# Patient Record
Sex: Female | Born: 2000 | Race: White | Hispanic: No | Marital: Single | Smoking: Never smoker
Health system: Southern US, Community
[De-identification: ages and names within clinical notes are randomized; demographics above are authoritative.]

## PROBLEM LIST (undated history)

## (undated) ENCOUNTER — Emergency Department (HOSPITAL_COMMUNITY): Payer: Medicaid Other | Source: Home / Self Care

## (undated) ENCOUNTER — Inpatient Hospital Stay (HOSPITAL_COMMUNITY): Payer: Self-pay

## (undated) DIAGNOSIS — K219 Gastro-esophageal reflux disease without esophagitis: Secondary | ICD-10-CM

## (undated) DIAGNOSIS — G43909 Migraine, unspecified, not intractable, without status migrainosus: Secondary | ICD-10-CM

## (undated) DIAGNOSIS — F419 Anxiety disorder, unspecified: Secondary | ICD-10-CM

---
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
## 2020-02-27 ENCOUNTER — Emergency Department (HOSPITAL_COMMUNITY): Admit: 2020-02-27 | Payer: Self-pay | Source: Home / Self Care

## 2021-12-12 ENCOUNTER — Inpatient Hospital Stay (HOSPITAL_COMMUNITY)
Admission: AD | Admit: 2021-12-12 | Discharge: 2021-12-12 | Disposition: A | Discharge status: Home / Self Care | Payer: Medicaid Other | Attending: Obstetrics and Gynecology | Admitting: Obstetrics and Gynecology

## 2021-12-12 ENCOUNTER — Encounter (HOSPITAL_COMMUNITY): Payer: Self-pay

## 2021-12-12 ENCOUNTER — Other Ambulatory Visit: Payer: Self-pay

## 2021-12-12 ENCOUNTER — Encounter (HOSPITAL_COMMUNITY): Payer: Self-pay | Admitting: Obstetrics and Gynecology

## 2021-12-12 ENCOUNTER — Ambulatory Visit (HOSPITAL_COMMUNITY)
Admission: EM | Admit: 2021-12-12 | Discharge: 2021-12-12 | Disposition: A | Discharge status: Home / Self Care | Payer: Medicaid Other | Attending: Physician Assistant | Admitting: Physician Assistant

## 2021-12-12 ENCOUNTER — Inpatient Hospital Stay (HOSPITAL_COMMUNITY): Payer: Medicaid Other

## 2021-12-12 DIAGNOSIS — O26891 Other specified pregnancy related conditions, first trimester: Secondary | ICD-10-CM | POA: Diagnosis not present

## 2021-12-12 DIAGNOSIS — N939 Abnormal uterine and vaginal bleeding, unspecified: Secondary | ICD-10-CM

## 2021-12-12 DIAGNOSIS — R112 Nausea with vomiting, unspecified: Secondary | ICD-10-CM

## 2021-12-12 DIAGNOSIS — R1030 Lower abdominal pain, unspecified: Secondary | ICD-10-CM | POA: Insufficient documentation

## 2021-12-12 DIAGNOSIS — Z3201 Encounter for pregnancy test, result positive: Secondary | ICD-10-CM | POA: Diagnosis not present

## 2021-12-12 DIAGNOSIS — O3680X0 Pregnancy with inconclusive fetal viability, not applicable or unspecified: Secondary | ICD-10-CM | POA: Insufficient documentation

## 2021-12-12 DIAGNOSIS — Z3A00 Weeks of gestation of pregnancy not specified: Secondary | ICD-10-CM | POA: Diagnosis not present

## 2021-12-12 DIAGNOSIS — O209 Hemorrhage in early pregnancy, unspecified: Secondary | ICD-10-CM | POA: Diagnosis not present

## 2021-12-12 DIAGNOSIS — R109 Unspecified abdominal pain: Secondary | ICD-10-CM | POA: Diagnosis not present

## 2021-12-12 DIAGNOSIS — O219 Vomiting of pregnancy, unspecified: Secondary | ICD-10-CM | POA: Insufficient documentation

## 2021-12-12 DIAGNOSIS — Z3A01 Less than 8 weeks gestation of pregnancy: Secondary | ICD-10-CM | POA: Insufficient documentation

## 2021-12-12 DIAGNOSIS — O26899 Other specified pregnancy related conditions, unspecified trimester: Secondary | ICD-10-CM

## 2021-12-12 LAB — CBC
HCT: 40.9 % (ref 36.0–46.0)
Hemoglobin: 13.5 g/dL (ref 12.0–15.0)
MCH: 28.7 pg (ref 26.0–34.0)
MCHC: 33 g/dL (ref 30.0–36.0)
MCV: 86.8 fL (ref 80.0–100.0)
Platelets: 220 10*3/uL (ref 150–400)
RBC: 4.71 MIL/uL (ref 3.87–5.11)
RDW: 14 % (ref 11.5–15.5)
WBC: 7.7 10*3/uL (ref 4.0–10.5)
nRBC: 0 % (ref 0.0–0.2)

## 2021-12-12 LAB — URINALYSIS, ROUTINE W REFLEX MICROSCOPIC
Bilirubin Urine: NEGATIVE
Glucose, UA: NEGATIVE mg/dL
Ketones, ur: 80 mg/dL — AB
Leukocytes,Ua: NEGATIVE
Nitrite: NEGATIVE
Protein, ur: 30 mg/dL — AB
RBC / HPF: >50 RBC/hpf — ABNORMAL HIGH (ref 0–5)
Specific Gravity, Urine: 1.021 (ref 1.005–1.030)
pH: 7 (ref 5.0–8.0)

## 2021-12-12 LAB — COMPREHENSIVE METABOLIC PANEL
ALT: 21 U/L (ref 0–44)
AST: 23 U/L (ref 15–41)
Albumin: 4.6 g/dL (ref 3.5–5.0)
Alkaline Phosphatase: 74 U/L (ref 38–126)
Anion gap: 14 (ref 5–15)
BUN: <5 mg/dL — ABNORMAL LOW (ref 6–20)
CO2: 21 mmol/L — ABNORMAL LOW (ref 22–32)
Calcium: 10.3 mg/dL (ref 8.9–10.3)
Chloride: 107 mmol/L (ref 98–111)
Creatinine, Ser: 0.63 mg/dL (ref 0.44–1.00)
GFR, Estimated: >60 mL/min (ref >60)
Glucose, Bld: 102 mg/dL — ABNORMAL HIGH (ref 70–99)
Potassium: 3.2 mmol/L — ABNORMAL LOW (ref 3.5–5.1)
Sodium: 142 mmol/L (ref 135–145)
Total Bilirubin: 1.9 mg/dL — ABNORMAL HIGH (ref 0.3–1.2)
Total Protein: 7.9 g/dL (ref 6.5–8.1)

## 2021-12-12 LAB — WET PREP, GENITAL
Clue Cells Wet Prep HPF POC: NONE SEEN
Sperm: NONE SEEN
Trich, Wet Prep: NONE SEEN
WBC, Wet Prep HPF POC: <10 (ref <10)
Yeast Wet Prep HPF POC: NONE SEEN

## 2021-12-12 LAB — POCT URINALYSIS DIPSTICK, ED / UC
Glucose, UA: NEGATIVE mg/dL
Ketones, ur: >=160 mg/dL — AB
Leukocytes,Ua: NEGATIVE
Nitrite: NEGATIVE
Protein, ur: 30 mg/dL — AB
Specific Gravity, Urine: 1.015 (ref 1.005–1.030)
Urobilinogen, UA: >=8 mg/dL (ref 0.0–1.0)
pH: 6 (ref 5.0–8.0)

## 2021-12-12 LAB — ABO/RH: ABO/RH(D): O POS

## 2021-12-12 LAB — HCG, QUANTITATIVE, PREGNANCY: hCG, Beta Chain, Quant, S: 1431 m[IU]/mL — ABNORMAL HIGH (ref <5)

## 2021-12-12 LAB — POC URINE PREG, ED: Preg Test, Ur: POSITIVE — AB

## 2021-12-12 MED ORDER — LACTATED RINGERS IV BOLUS
1000.0000 mL | Freq: Once | INTRAVENOUS | Status: AC
  Administered 2021-12-12: 1000 mL via INTRAVENOUS

## 2021-12-12 MED ORDER — METOCLOPRAMIDE HCL 5 MG/ML IJ SOLN
10.0000 mg | Freq: Once | INTRAMUSCULAR | Status: AC
  Administered 2021-12-12: 10 mg via INTRAVENOUS
  Filled 2021-12-12: qty 2

## 2021-12-12 MED ORDER — METOCLOPRAMIDE HCL 10 MG PO TABS: 10.0000 mg | ORAL_TABLET | Freq: Four times a day (QID) | ORAL | 0 refills | Status: DC

## 2021-12-12 NOTE — MAU Note (Signed)
Deanna Garcia is a 21 y.o. at [redacted]w[redacted]d here in MAU reporting: for the past 4 days has not been able to keep anything down. About 4 episodes of emesis. Upper abdominal cramping. Has also seen some spotting, that started 4 days ago also.  LMP: 11/02/2021  Onset of complaint: 4 days  Pain score: 3/10  Vitals:   12/12/21 1257  BP: 134/73  Pulse: 82  Resp: 16  Temp: 98.2 F (36.8 C)  SpO2: 98%     Lab orders placed from triage: none

## 2021-12-12 NOTE — ED Provider Notes (Signed)
[facility]    CSN: [id number] Arrival date & time: 12/12/21  1119      History   Chief Complaint Chief Complaint  Patient presents with   Emesis   Possible Pregnancy    HPI Deanna Garcia is a 21 y.o. female.   Patient presents today with a 4-day history of persistent nausea and vomiting.  She does report some generalized abdominal discomfort as well as some diarrhea.  Denies any urinary symptoms including frequency, urgency, dysuria.  She did take an at-home pregnancy test approximately 4 days ago which was positive.  Reports LMP 11/02/2021.  This is her second pregnancy.  She did not have any complications with her previous pregnancy though she did require a C-section.  She is not currently on any birth control.  She does not take medication on a regular basis.  She reports that over the past 4 days she has only been able to eat a few slices of pizza and drink some Gatorade.  Has become persistently harder for her to eat and drink due to severity of symptoms.  She denies any recent medication changes, antibiotic use, suspicious food intake, exposure to sick contacts, recent travel.  Denies history of gastrointestinal disorder other than GERD.    Past Medical History:  Diagnosis Date   Anxiety    Asthma    GERD (gastroesophageal reflux disease)    Migraines     There are no problems to display for this patient.   History reviewed. No pertinent surgical history.  OB History   No obstetric history on file.      Home Medications    Prior to Admission medications   Medication Sig Start Date End Date Taking? Authorizing Provider  acetaminophen (TYLENOL) 325 MG tablet Take 650 mg by mouth every 6 (six) hours as needed. For pain    [provider]  ALAYCEN 1/35 tablet Take 1 tablet by mouth daily. 07/15/14   [provider]  albuterol (PROVENTIL HFA;VENTOLIN HFA) 108 (90 BASE) MCG/ACT inhaler Inhale 2 puffs into the lungs every 6 (six) hours as  needed. For shortness of breath/wheezing    [provider]  clindamycin (CLEOCIN) 150 MG capsule Take 1 capsule (150 mg total) by mouth 4 (four) times daily. Patient taking differently: Take 300 mg by mouth 2 (two) times daily.  08/24/14   Earley Favor, NP  dicyclomine (BENTYL) 20 MG tablet Take 1 tablet (20 mg total) by mouth 2 (two) times daily. 08/28/14   Rolland Porter, MD  diphenoxylate-atropine (LOMOTIL) 2.5-0.025 MG per tablet Take 1 tablet by mouth 4 (four) times daily as needed for diarrhea or loose stools. 08/28/14   Rolland Porter, MD  FLUoxetine (PROZAC) 40 MG capsule Take 40 mg by mouth daily.    [provider]  hydrOXYzine (VISTARIL) 25 MG capsule Take 25 mg by mouth 3 (three) times daily as needed for anxiety.    [provider]  ondansetron (ZOFRAN ODT) 4 MG disintegrating tablet Take 1 tablet (4 mg total) by mouth every 8 (eight) hours as needed for nausea. 08/28/14   Rolland Porter, MD  predniSONE (DELTASONE) 20 MG tablet Take 3 tablets (60 mg total) by mouth daily. Patient not taking: Reported on 08/28/2014 09/03/13   Niel Hummer, MD  QVAR 80 MCG/ACT inhaler Take 1 puff by mouth 2 (two) times daily as needed. Shortness of breath 07/15/14   [provider]    Family History History reviewed. No pertinent family history.  Social  History Social History   Tobacco Use   Smoking status: Passive Smoke Exposure - Never Smoker   Smokeless tobacco: Never  Substance Use Topics   Alcohol use: No   Drug use: No     Allergies   Penicillins and Amoxicillin   Review of Systems Review of Systems  Constitutional:  Positive for activity change. Negative for appetite change, fatigue and fever.  Respiratory:  Negative for cough and shortness of breath.   Cardiovascular:  Negative for chest pain.  Gastrointestinal:  Positive for abdominal pain, diarrhea, nausea and vomiting.  Genitourinary:  Positive for menstrual problem. Negative for dysuria, frequency, pelvic  pain, urgency, vaginal bleeding, vaginal discharge and vaginal pain.     Physical Exam Triage Vital Signs ED Triage Vitals  Enc Vitals Group     BP 12/12/21 1140 134/81     Pulse Rate 12/12/21 1140 80     Resp 12/12/21 1140 16     Temp 12/12/21 1140 99.5 F (37.5 C)     Temp Source 12/12/21 1140 Oral     SpO2 12/12/21 1140 97 %     Weight 12/12/21 1142 220 lb 0.3 oz (99.8 kg)     Height 12/12/21 1142 5\' 8"  (1.727 m)     Head Circumference --      Peak Flow --      Pain Score 12/12/21 1140 4     Pain Loc --      Pain Edu? --      Excl. in GC? --    No data found.  Updated Vital Signs BP 134/81 (BP Location: Left Arm)   Pulse 80   Temp 99.5 F (37.5 C) (Oral)   Resp 16   Ht 5\' 8"  (1.727 m)   Wt 220 lb 0.3 oz (99.8 kg)   LMP 11/02/2021 (Exact Date)   SpO2 97%   BMI 33.45 kg/m   Visual Acuity Right Eye Distance:   Left Eye Distance:   Bilateral Distance:    Right Eye Near:   Left Eye Near:    Bilateral Near:     Physical Exam Vitals reviewed.  Constitutional:      General: She is awake. She is not in acute distress.    Appearance: Normal appearance. She is well-developed. She is not ill-appearing.     Comments: Very pleasant female appears stated age in no acute distress sitting comfortably in exam room  HENT:     Head: Normocephalic and atraumatic.     Mouth/Throat:     Mouth: Mucous membranes are moist.     Pharynx: Uvula midline. No oropharyngeal exudate or posterior oropharyngeal erythema.  Cardiovascular:     Rate and Rhythm: Normal rate and regular rhythm.     Heart sounds: Normal heart sounds, S1 normal and S2 normal. No murmur heard. Pulmonary:     Effort: Pulmonary effort is normal.     Breath sounds: Normal breath sounds. No wheezing, rhonchi or rales.     Comments: Clear to auscultation bilaterally Abdominal:     General: Bowel sounds are normal.     Palpations: Abdomen is soft.     Tenderness: There is no abdominal tenderness. There is no  right CVA tenderness, left CVA tenderness, guarding or rebound.     Comments: Benign abdominal exam  Psychiatric:        Behavior: Behavior is cooperative.      UC Treatments / Results  Labs (all labs ordered are listed, but only abnormal results are  displayed) Labs Reviewed  POC URINE PREG, ED - Abnormal; Notable for the following components:      Result Value   Preg Test, Ur POSITIVE (*)    All other components within normal limits  POCT URINALYSIS DIPSTICK, ED / UC - Abnormal; Notable for the following components:   Bilirubin Urine SMALL (*)    Ketones, ur >=160 (*)    Hgb urine dipstick LARGE (*)    Protein, ur 30 (*)    All other components within normal limits    EKG   Radiology No results found.  Procedures Procedures (including critical care time)  Medications Ordered in UC Medications - No data to display  Initial Impression / Assessment and Plan / UC Course  I have reviewed the triage vital signs and the nursing notes.  Pertinent labs & imaging results that were available during my care of the patient were reviewed by me and considered in my medical decision making (see chart for details).     Pregnancy test was positive in clinic today.  UA showed evidence of decreased oral intake and significant dehydration.  Discussed that she would be better suited in the MAU for further evaluation and management as they have the ability to get stat labs and provide symptomatic management.  She is agreeable and will go directly to MAU for further evaluation and management.  Her vital signs are stable at time of discharge.  She is safe for private transport.  Final Clinical Impressions(s) / UC Diagnoses   Final diagnoses:  Intractable nausea and vomiting  Positive pregnancy test     Discharge Instructions      Please go to the MAU for further evaluation and management.     ED Prescriptions   None    PDMP not reviewed this encounter.   Jeani Hawking,  PA-C 12/12/21 1204

## 2021-12-12 NOTE — MAU Provider Note (Signed)
History     CSN: [id number]  Arrival date and time: 12/12/21 1210   None     Chief Complaint  Patient presents with   Abdominal Pain   Vaginal Bleeding   Nausea   HPI Deanna Garcia is a 21 y.o. G2P1001 at [redacted]w[redacted]d by LMP who presents to MAU for nausea, vomiting, and vaginal spotting. Nausea and vomiting started 4 days ago. She reports that she has had numerous episodes of vomiting, approximately 6 per day as well as dry heaves. She reports that she has only tolerated 2 slices of pizza and gatorade since the vomiting started. She reports she has not had anything to eat or drink today. She does not have any nausea medications at home. She is also reporting some vaginal spotting a mid-lower abdominal cramping that started 5 days ago. Pain is intermittent and is not worsened or aggravated by anything. She currently rates pain 3/10. She denies urinary s/s, vaginal discharge, odor, or itching. LMP was 11/02/21. Patient does not have an OBGYN.   OB History     Gravida  2   Para  1   Term  1   Preterm  0   AB  0   Living  1      SAB  0   IAB  0   Ectopic  0   Multiple  0   Live Births  1           Past Medical History:  Diagnosis Date   Anxiety    Asthma    GERD (gastroesophageal reflux disease)    Migraines     Past Surgical History:  Procedure Laterality Date   CESAREAN SECTION      History reviewed. No pertinent family history.  Social History   Tobacco Use   Smoking status: Never    Passive exposure: Yes   Smokeless tobacco: Never  Substance Use Topics   Alcohol use: No   Drug use: Not Currently    Types: Marijuana    Allergies:  Allergies  Allergen Reactions   Penicillins Hives   Amoxicillin Rash    Medications Prior to Admission  Medication Sig Dispense Refill Last Dose   acetaminophen (TYLENOL) 325 MG tablet Take 650 mg by mouth every 6 (six) hours as needed. For pain      albuterol (PROVENTIL HFA;VENTOLIN HFA) 108 (90 BASE) MCG/ACT  inhaler Inhale 2 puffs into the lungs every 6 (six) hours as needed. For shortness of breath/wheezing       Review of Systems  Constitutional: Negative.   Respiratory: Negative.    Cardiovascular: Negative.   Gastrointestinal:  Positive for abdominal pain, nausea and vomiting.  Genitourinary:  Positive for vaginal bleeding. Negative for dysuria, frequency and hematuria.  Musculoskeletal: Negative.   Neurological: Negative.    Physical Exam   Blood pressure 134/73, pulse 82, temperature 98.2 F (36.8 C), temperature source Oral, resp. rate 16, height 5\' 8"  (1.727 m), weight 109.5 kg, last menstrual period 11/02/2021, SpO2 98 %.  Physical Exam Vitals and nursing note reviewed.  Constitutional:      General: She is not in acute distress. Cardiovascular:     Rate and Rhythm: Normal rate.  Pulmonary:     Effort: Pulmonary effort is normal.  Abdominal:     Palpations: Abdomen is soft.     Tenderness: There is no abdominal tenderness. There is no guarding.  Genitourinary:    Comments: Blind swabs collected Skin:    General: Skin is  warm and dry.  Neurological:     General: No focal deficit present.     Mental Status: She is alert and oriented to person, place, and time.  Psychiatric:        Mood and Affect: Mood normal.        Behavior: Behavior normal.   US OB LESS THAN 14 WEEKS WITH OB TRANSVAGINAL  Result Date: 12/12/2021 CLINICAL DATA:  Abdominal pain EXAM: OBSTETRIC <14 WK ULTRASOUND TECHNIQUE: Transabdominal ultrasound was performed for evaluation of the gestation as well as the maternal uterus and adnexal regions. COMPARISON:  None Available. FINDINGS: Intrauterine gestational sac: None Yolk sac:  Not Visualized. Embryo:  Not Visualized. Cardiac Activity: Not Visualized. Subchorionic hemorrhage:  None visualized. Maternal uterus/adnexae: Normal appearance of the bilateral ovaries. No free fluid in the pelvis. IMPRESSION: No intrauterine gestational sac, yolk sac, or fetal  pole identified. In the setting of positive pregnancy test and no definite intrauterine pregnancy, this reflects a pregnancy of unknown location. Differential considerations include early normal IUP, abnormal IUP, or nonvisualized ectopic pregnancy. Differentiation is achieved with serial beta HCG supplemented by repeat sonography as clinically warranted. Electronically Signed   By: Allegra Lai M.D.   On: 12/12/2021 15:20    MAU Course  Procedures  MDM UA, urine culture, CBC, CMP, HCG, ABO/RH, wet prep, GC/CT, [place], 2L LR bolus and Reglan ordered  Patient without any episodes of vomiting throughout stay. Reports improvement in nausea after fluids and medication. Will send rx to patient's pharmacy. Patient encouraged to push fluids, small/frequent bland meals/snacks and then advance diet as tolerated.  Blood type O pos, Rhogam not indicated. Wet prep negative, GC/CT pending.   HCG 1400 without evidence of GS or ectopic pregnancy. Differentials include early normal IUP, abnormal IUP, or non-visualized ectopic. Patient to return in 48 hours for repeat bHCG. Due to schedule, patient will return to MAU Saturday morning (7/22) for labs. Strict ectopic precautions reviewed.    Assessment and Plan  Pregnancy of unknown anatomic location Nausea and vomiting in pregnancy Abdominal pain in pregnancy Vaginal spotting in pregnancy  - Discharge home in stable condition - Rx for Reglan sent to pharmacy - Repeat bHCG on Saturday 7/22 due to patient schedule - Ectopic precautions reviewed - Return to MAU sooner for new/worsening symptoms    Brand Males, CNM 12/12/2021, 4:49 PM

## 2021-12-12 NOTE — Discharge Instructions (Signed)
Please go to the MAU for further evaluation and management.

## 2021-12-12 NOTE — ED Triage Notes (Signed)
Patient states she has been throwing up constantly for the past 4 days, unable to keep anything down. Patient took home pregnancy tests and these were positive.   No known dietary or medication changes. No one at home with similar symptoms.

## 2021-12-13 LAB — GC/CHLAMYDIA PROBE AMP (~~LOC~~) NOT AT ARMC
Chlamydia: NEGATIVE
Comment: NEGATIVE
Comment: NORMAL
Neisseria Gonorrhea: NEGATIVE

## 2021-12-14 ENCOUNTER — Encounter (HOSPITAL_COMMUNITY): Payer: Self-pay | Admitting: Obstetrics and Gynecology

## 2021-12-14 ENCOUNTER — Inpatient Hospital Stay (HOSPITAL_COMMUNITY)
Admission: AD | Admit: 2021-12-14 | Discharge: 2021-12-14 | Disposition: A | Discharge status: Home / Self Care | Payer: Medicaid Other | Attending: Obstetrics and Gynecology | Admitting: Obstetrics and Gynecology

## 2021-12-14 DIAGNOSIS — O009 Unspecified ectopic pregnancy without intrauterine pregnancy: Secondary | ICD-10-CM | POA: Diagnosis not present

## 2021-12-14 DIAGNOSIS — Z6790 Unspecified blood type, Rh positive: Secondary | ICD-10-CM

## 2021-12-14 DIAGNOSIS — O3680X0 Pregnancy with inconclusive fetal viability, not applicable or unspecified: Secondary | ICD-10-CM

## 2021-12-14 LAB — CBC
HCT: 35 % — ABNORMAL LOW (ref 36.0–46.0)
Hemoglobin: 11.5 g/dL — ABNORMAL LOW (ref 12.0–15.0)
MCH: 28.7 pg (ref 26.0–34.0)
MCHC: 32.9 g/dL (ref 30.0–36.0)
MCV: 87.3 fL (ref 80.0–100.0)
Platelets: 179 10*3/uL (ref 150–400)
RBC: 4.01 MIL/uL (ref 3.87–5.11)
RDW: 14.2 % (ref 11.5–15.5)
WBC: 5.4 10*3/uL (ref 4.0–10.5)
nRBC: 0 % (ref 0.0–0.2)

## 2021-12-14 LAB — HCG, QUANTITATIVE, PREGNANCY: hCG, Beta Chain, Quant, S: 1413 m[IU]/mL — ABNORMAL HIGH (ref <5)

## 2021-12-14 NOTE — MAU Provider Note (Signed)
History     CSN: [id number]  Arrival date and time: 12/14/21 0846   Event Date/Time   First Provider Initiated Contact with Patient 12/14/21 [phone]      Chief Complaint  Patient presents with   Vaginal Bleeding   HPI Deanna Garcia is a 21 y.o. G2P1001at [redacted]w[redacted]d by LMP who presents to MAU with chief complaint of bleeding. This is a recurrent problem for which patient was evaluated in MAU on 07/19 and diagnosed with Pregnancy of Unknown Location. She endorses seeing smears of blood when she wiped after voiding this morning. This happened on two occasions.  She denies ongoing active vaginal bleeding. She has not needed to don a pad. She has not visualized clots and is not experiencing dizziness, weakness or activity intolerance. She is remote from sexual intercourse.  She also reports ongoing lower abdominal pain. Pain score 4/10 on arrival to MAU. She has not taken medication for this complaint and declines pain medication on arrival to MAU.   OB History     Gravida  2   Para  1   Term  1   Preterm  0   AB  0   Living  1      SAB  0   IAB  0   Ectopic  0   Multiple  0   Live Births  1           Past Medical History:  Diagnosis Date   Anxiety    Asthma    GERD (gastroesophageal reflux disease)    Migraines     Past Surgical History:  Procedure Laterality Date   CESAREAN SECTION      No family history on file.  Social History   Tobacco Use   Smoking status: Never    Passive exposure: Yes   Smokeless tobacco: Never  Substance Use Topics   Alcohol use: No   Drug use: Not Currently    Types: Marijuana    Allergies:  Allergies  Allergen Reactions   Penicillins Hives   Amoxicillin Rash    Medications Prior to Admission  Medication Sig Dispense Refill Last Dose   metoCLOPramide (REGLAN) 10 MG tablet Take 1 tablet (10 mg total) by mouth every 6 (six) hours. 30 tablet 0 12/14/2021   acetaminophen (TYLENOL) 325 MG tablet Take 650 mg by mouth every  6 (six) hours as needed. For pain      albuterol (PROVENTIL HFA;VENTOLIN HFA) 108 (90 BASE) MCG/ACT inhaler Inhale 2 puffs into the lungs every 6 (six) hours as needed. For shortness of breath/wheezing       Review of Systems  Gastrointestinal:  Positive for abdominal pain.  Genitourinary:  Positive for vaginal bleeding.  All other systems reviewed and are negative.  Physical Exam   Blood pressure (!) 147/80, pulse 69, temperature 98.3 F (36.8 C), resp. rate 18, height 5\' 8"  (1.727 m), weight 112 kg, last menstrual period 11/02/2021.  Physical Exam Vitals and nursing note reviewed. Exam conducted with a chaperone present.  Constitutional:      Appearance: Normal appearance. She is not ill-appearing.  Cardiovascular:     Rate and Rhythm: Normal rate and regular rhythm.     Pulses: Normal pulses.     Heart sounds: Normal heart sounds.  Pulmonary:     Effort: Pulmonary effort is normal.     Breath sounds: Normal breath sounds.  Abdominal:     Tenderness: There is no abdominal tenderness. There is no right CVA  tenderness or left CVA tenderness.  Genitourinary:    Comments: Pelvic exam: External genitalia normal, vaginal walls pink and well rugated, cervix visually closed, no lesions noted. Negligible dark red blood visible on tip of speculum. No blood visualized in vault.    Skin:    Capillary Refill: Capillary refill takes less than 2 seconds.  Neurological:     Mental Status: She is alert and oriented to person, place, and time.  Psychiatric:        Mood and Affect: Mood normal.        Behavior: Behavior normal.        Thought Content: Thought content normal.        Judgment: Judgment normal.     MAU Course  Procedures  MDM  --EMR reviewed. Patient in MAU 07/19. PUL on ultrasound. Discussed with patient repeat ultrasound not indicated today unless gross change in symptoms. Patient verbalizes understanding.  Orders Placed This Encounter  Procedures   CBC   hCG,  quantitative, pregnancy   Patient Vitals for the past 24 hrs:  BP Temp Pulse Resp Height Weight  12/14/21 1130 140/73 -- 63 -- -- --  12/14/21 0859 (!) 147/80 98.3 F (36.8 C) 69 18 5\' 8"  (1.727 m) 112 kg   Results for orders placed or performed during the hospital encounter of 12/14/21 (from the past 24 hour(s))  CBC     Status: Abnormal   Collection Time: 12/14/21  9:19 AM  Result Value Ref Range   WBC 5.4 4.0 - 10.5 K/uL   RBC 4.01 3.87 - 5.11 MIL/uL   Hemoglobin 11.5 (L) 12.0 - 15.0 g/dL   HCT 35.0 (L) 36.0 - 46.0 %   MCV 87.3 80.0 - 100.0 fL   MCH 28.7 26.0 - 34.0 pg   MCHC 32.9 30.0 - 36.0 g/dL   RDW 14.2 11.5 - 15.5 %   Platelets 179 150 - 400 K/uL   nRBC 0.0 0.0 - 0.2 %   Component     Latest Ref Rng 12/12/2021 12/14/2021  HCG, Beta Chain, Quant, S     <5 mIU/mL 1,431 (H)  1,413 (H)     Ultrasound report from 07/19:  Narrative & Impression  CLINICAL DATA:  Abdominal pain   EXAM: OBSTETRIC <14 WK ULTRASOUND   TECHNIQUE: Transabdominal ultrasound was performed for evaluation of the gestation as well as the maternal uterus and adnexal regions.   COMPARISON:  None Available.   FINDINGS: Intrauterine gestational sac: None   Yolk sac:  Not Visualized.   Embryo:  Not Visualized.   Cardiac Activity: Not Visualized.   Subchorionic hemorrhage:  None visualized.   Maternal uterus/adnexae: Normal appearance of the bilateral ovaries. No free fluid in the pelvis.   IMPRESSION: No intrauterine gestational sac, yolk sac, or fetal pole identified. In the setting of positive pregnancy test and no definite intrauterine pregnancy, this reflects a pregnancy of unknown location. Differential considerations include early normal IUP, abnormal IUP, or nonvisualized ectopic pregnancy. Differentiation is achieved with serial beta HCG supplemented by repeat sonography as clinically warranted.     Electronically Signed   By: Yetta Glassman M.D.   On: 12/12/2021 15:20    Assessment and Plan  --21 y.o. G2P1001 with previously PUL (diagnosed about 48 hours ago) --Inappropriate change in quant hCG --Hgb now 11.5 --Blood type Rh POS --Discharge home in stable condition with ectopic/bleeding precautions  F/U: --Appt made for repeat stat Quant hCG at [facility] on 12/17/2021  Darlina Rumpf, [place],  MSN, CNM 12/14/2021, 1:36 PM

## 2021-12-14 NOTE — MAU Note (Addendum)
.  Deanna Garcia is a 21 y.o. at [redacted]w[redacted]d here in MAU reporting: increased vaginal bleeding since her last visit 2 days ago. Pt stated she started having some more cramping last night and bleeding is more than it was there other day. Scheduled for repeat BHCG tomorrow.  LMP:  Onset of complaint: today Pain score: 4 Vitals:   12/14/21 0859  BP: (!) 147/80  Pulse: 69  Resp: 18  Temp: 98.3 F (36.8 C)     FHT:n/a Lab orders placed from triage:

## 2021-12-15 ENCOUNTER — Other Ambulatory Visit (HOSPITAL_COMMUNITY): Payer: Self-pay

## 2021-12-15 LAB — CULTURE, OB URINE

## 2021-12-17 ENCOUNTER — Encounter (HOSPITAL_COMMUNITY): Payer: Self-pay | Admitting: Obstetrics and Gynecology

## 2021-12-17 ENCOUNTER — Other Ambulatory Visit: Payer: Self-pay

## 2021-12-17 ENCOUNTER — Inpatient Hospital Stay (HOSPITAL_COMMUNITY)
Admission: AD | Admit: 2021-12-17 | Discharge: 2021-12-17 | Disposition: A | Discharge status: Home / Self Care | Payer: Medicaid Other | Attending: Obstetrics and Gynecology | Admitting: Obstetrics and Gynecology

## 2021-12-17 ENCOUNTER — Ambulatory Visit (INDEPENDENT_AMBULATORY_CARE_PROVIDER_SITE_OTHER): Payer: Self-pay | Admitting: General Practice

## 2021-12-17 VITALS — BP 134/74 | HR 66 | Ht 68.0 in | Wt 249.0 lb

## 2021-12-17 DIAGNOSIS — O3680X0 Pregnancy with inconclusive fetal viability, not applicable or unspecified: Secondary | ICD-10-CM

## 2021-12-17 DIAGNOSIS — Z3A01 Less than 8 weeks gestation of pregnancy: Secondary | ICD-10-CM | POA: Diagnosis not present

## 2021-12-17 DIAGNOSIS — O009 Unspecified ectopic pregnancy without intrauterine pregnancy: Secondary | ICD-10-CM | POA: Diagnosis not present

## 2021-12-17 LAB — COMPREHENSIVE METABOLIC PANEL
ALT: 17 U/L (ref 0–44)
AST: 16 U/L (ref 15–41)
Albumin: 3.9 g/dL (ref 3.5–5.0)
Alkaline Phosphatase: 63 U/L (ref 38–126)
Anion gap: 7 (ref 5–15)
BUN: <5 mg/dL — ABNORMAL LOW (ref 6–20)
CO2: 24 mmol/L (ref 22–32)
Calcium: 9.2 mg/dL (ref 8.9–10.3)
Chloride: 109 mmol/L (ref 98–111)
Creatinine, Ser: 0.62 mg/dL (ref 0.44–1.00)
GFR, Estimated: >60 mL/min (ref >60)
Glucose, Bld: 104 mg/dL — ABNORMAL HIGH (ref 70–99)
Potassium: 3.5 mmol/L (ref 3.5–5.1)
Sodium: 140 mmol/L (ref 135–145)
Total Bilirubin: 0.7 mg/dL (ref 0.3–1.2)
Total Protein: 6.5 g/dL (ref 6.5–8.1)

## 2021-12-17 LAB — CBC WITH DIFFERENTIAL/PLATELET
Abs Immature Granulocytes: 0.03 10*3/uL (ref 0.00–0.07)
Basophils Absolute: 0 10*3/uL (ref 0.0–0.1)
Basophils Relative: 1 %
Eosinophils Absolute: 0.1 10*3/uL (ref 0.0–0.5)
Eosinophils Relative: 1 %
HCT: 35.4 % — ABNORMAL LOW (ref 36.0–46.0)
Hemoglobin: 11.6 g/dL — ABNORMAL LOW (ref 12.0–15.0)
Immature Granulocytes: 0 %
Lymphocytes Relative: 26 %
Lymphs Abs: 2 10*3/uL (ref 0.7–4.0)
MCH: 29.4 pg (ref 26.0–34.0)
MCHC: 32.8 g/dL (ref 30.0–36.0)
MCV: 89.6 fL (ref 80.0–100.0)
Monocytes Absolute: 0.8 10*3/uL (ref 0.1–1.0)
Monocytes Relative: 11 %
Neutro Abs: 4.8 10*3/uL (ref 1.7–7.7)
Neutrophils Relative %: 61 %
Platelets: 185 10*3/uL (ref 150–400)
RBC: 3.95 MIL/uL (ref 3.87–5.11)
RDW: 14.6 % (ref 11.5–15.5)
WBC: 7.9 10*3/uL (ref 4.0–10.5)
nRBC: 0 % (ref 0.0–0.2)

## 2021-12-17 LAB — BETA HCG QUANT (REF LAB): hCG Quant: 1341 m[IU]/mL

## 2021-12-17 LAB — HCG, QUANTITATIVE, PREGNANCY: hCG, Beta Chain, Quant, S: 1583 m[IU]/mL — ABNORMAL HIGH (ref <5)

## 2021-12-17 MED ORDER — METHOTREXATE FOR ECTOPIC PREGNANCY
50.0000 mg/m2 | Freq: Once | INTRAMUSCULAR | Status: AC
  Administered 2021-12-17: 117.5 mg via INTRAMUSCULAR
  Filled 2021-12-17: qty 10

## 2021-12-17 NOTE — MAU Provider Note (Signed)
History     [id number]  Arrival date and time: 12/17/21 1349    Chief Complaint  Patient presents with   Ectopic Pregnancy     HPI Deanna Garcia is a 21 y.o. at [redacted]w[redacted]d who presents for management of ectopic. Has had abnormal trend of HCGs.   7/19 1431 7/21 1413 7/23 1341 Reports some vaginal bleeding. Not saturating pads or passing blood clots. Also complains of generalized abdominal cramping. Nothing seen on ultrasound last week. Was sent here from the office for methotrexate.   OB History     Gravida  2   Para  1   Term  1   Preterm  0   AB  0   Living  1      SAB  0   IAB  0   Ectopic  0   Multiple  0   Live Births  1           Past Medical History:  Diagnosis Date   Anxiety    Asthma    GERD (gastroesophageal reflux disease)    Migraines     Past Surgical History:  Procedure Laterality Date   CESAREAN SECTION      No family history on file.  Allergies  Allergen Reactions   Penicillins Hives   Amoxicillin Rash    No current facility-administered medications on file prior to encounter.   Current Outpatient Medications on File Prior to Encounter  Medication Sig Dispense Refill   acetaminophen (TYLENOL) 325 MG tablet Take 650 mg by mouth every 6 (six) hours as needed. For pain     albuterol (PROVENTIL HFA;VENTOLIN HFA) 108 (90 BASE) MCG/ACT inhaler Inhale 2 puffs into the lungs every 6 (six) hours as needed. For shortness of breath/wheezing     metoCLOPramide (REGLAN) 10 MG tablet Take 1 tablet (10 mg total) by mouth every 6 (six) hours. 30 tablet 0     ROS Pertinent positives and negative per HPI, all others reviewed and negative  Physical Exam   BP 140/90   Pulse 82   Temp 98.3 F (36.8 C)   Resp 18   LMP 11/02/2021 (Exact Date)   Patient Vitals for the past 24 hrs:  BP Temp Pulse Resp  12/17/21 1434 140/90 98.3 F (36.8 C) 82 18    Physical Exam Vitals and nursing note reviewed.  Constitutional:      General:  She is not in acute distress.    Appearance: Normal appearance. She is not diaphoretic.  HENT:     Head: Normocephalic and atraumatic.  Eyes:     General: No scleral icterus.    Conjunctiva/sclera: Conjunctivae normal.  Pulmonary:     Effort: Pulmonary effort is normal. No respiratory distress.  Abdominal:     Palpations: Abdomen is soft.     Tenderness: There is no abdominal tenderness.  Neurological:     Mental Status: She is alert.  Psychiatric:        Mood and Affect: Mood normal.        Behavior: Behavior normal.       Labs Results for orders placed or performed during the hospital encounter of 12/17/21 (from the past 24 hour(s))  CBC with Differential/Platelet     Status: Abnormal   Collection Time: 12/17/21  2:57 PM  Result Value Ref Range   WBC 7.9 4.0 - 10.5 K/uL   RBC 3.95 3.87 - 5.11 MIL/uL   Hemoglobin 11.6 (L) 12.0 - 15.0 g/dL  HCT 35.4 (L) 36.0 - 46.0 %   MCV 89.6 80.0 - 100.0 fL   MCH 29.4 26.0 - 34.0 pg   MCHC 32.8 30.0 - 36.0 g/dL   RDW 37.8 58.8 - 50.2 %   Platelets 185 150 - 400 K/uL   nRBC 0.0 0.0 - 0.2 %   Neutrophils Relative % 61 %   Neutro Abs 4.8 1.7 - 7.7 K/uL   Lymphocytes Relative 26 %   Lymphs Abs 2.0 0.7 - 4.0 K/uL   Monocytes Relative 11 %   Monocytes Absolute 0.8 0.1 - 1.0 K/uL   Eosinophils Relative 1 %   Eosinophils Absolute 0.1 0.0 - 0.5 K/uL   Basophils Relative 1 %   Basophils Absolute 0.0 0.0 - 0.1 K/uL   Immature Granulocytes 0 %   Abs Immature Granulocytes 0.03 0.00 - 0.07 K/uL  Comprehensive metabolic panel     Status: Abnormal   Collection Time: 12/17/21  2:57 PM  Result Value Ref Range   Sodium 140 135 - 145 mmol/L   Potassium 3.5 3.5 - 5.1 mmol/L   Chloride 109 98 - 111 mmol/L   CO2 24 22 - 32 mmol/L   Glucose, Bld 104 (H) 70 - 99 mg/dL   BUN <5 (L) 6 - 20 mg/dL   Creatinine, Ser 7.74 0.44 - 1.00 mg/dL   Calcium 9.2 8.9 - 12.8 mg/dL   Total Protein 6.5 6.5 - 8.1 g/dL   Albumin 3.9 3.5 - 5.0 g/dL   AST 16 15 - 41  U/L   ALT 17 0 - 44 U/L   Alkaline Phosphatase 63 38 - 126 U/L   Total Bilirubin 0.7 0.3 - 1.2 mg/dL   GFR, Estimated >78 >67 mL/min   Anion gap 7 5 - 15  hCG, quantitative, pregnancy     Status: Abnormal   Collection Time: 12/17/21  2:58 PM  Result Value Ref Range   hCG, Beta Chain, Quant, S 1,583 (H) <5 mIU/mL    Imaging No results found.  MAU Course  Procedures Lab Orders         hCG, quantitative, pregnancy         CBC with Differential/Platelet         Comprehensive metabolic panel     Meds ordered this encounter  Medications   methotrexate (for ectopic pregnancy) 25 mg/mL chemo injection   Imaging Orders  No imaging studies ordered today    MDM Reviewed HCGs with Dr. Crissie Reese, will proceed with methotrexate.   The risks of methotrexate were reviewed including failure requiring repeat dosing or eventual surgery. She understands that methotrexate involves frequent return visits to monitor lab values and that she remains at risk of ectopic rupture until her beta is less than assay. ?The patient opts to proceed with methotrexate.  She has no history of hepatic or renal dysfunction, has normal BUN/Cr/LFT's/platelets.  She is felt to be reliable for follow-up. Side effects of photosensitivity & GI upset were discussed.  She knows to avoid direct sunlight and abstain from alcohol, aspirin and aspirin-like products for two weeks. She was counseled to discontinue any MVI with folic acid. ?She understands to follow up on D4 (Thursday) and D7 (Sunday) for repeat BHCG and was given the instruction sheet. ?Strict ectopic precautions were reviewed, the patient knows to call with any abdominal pain, vomiting, fainting, or any concerns with her health.  RH +, rhogam not indicated  Assessment and Plan   1. Ectopic pregnancy without intrauterine pregnancy, unspecified  location   2. [redacted] weeks gestation of pregnancy    -Scheduled for day 4 HCG in the office on Thursday morning -Reviewed  reasons to return to MAU -Given written education regarding ectopic pregnancy & methotrexate  Judeth Horn, NP 12/17/21 6:29 PM

## 2021-12-17 NOTE — Progress Notes (Signed)
Beta HCG Follow-up Visit  DEMETRICA ZIPP presents to [facility] for follow-up beta HCG lab. She was seen in MAU for  spotting and cramping  on 7/19 & 7/21. Patient reports  increased bleeding since then similar to a light period as well as mild cramping. Discussed with patient that we are following beta HCG levels today. Results will be back in approximately 2 hours. Valid contact number for patient confirmed. I will call the patient with results.   Beta HCG results:         7/19        1,431          7/21        1,413          7/24        1,341   Results and patient history reviewed with Dr Donavan Foil, who states patient's bhcg levels are not rising appropriately and are concerning for ectopic pregnancy. She should go to MAU for possible MTX. Patient called and informed of plan for follow-up. Patient verbalized understanding. MAU notified.  Marylynn Pearson 12/17/2021 8:25 AM

## 2021-12-17 NOTE — Discharge Instructions (Signed)
Return to care  If you have heavier bleeding that soaks through more than 2 pads per hour for an hour or more If you bleed so much that you feel like you might pass out or you do pass out If you have significant abdominal pain that is not improved with Tylenol   

## 2021-12-17 NOTE — MAU Note (Signed)
.  Deanna Garcia is a 21 y.o. at [redacted]w[redacted]d here in MAU reporting: sent from office after HCG level. Possible ectopic and MTX treatment. Reports mild cramping and some small bleeding little less than a period with small clots.  LMP:  Onset of complaint:  Pain score: 3 There were no vitals filed for this visit.   FHT: Lab orders placed from triage:

## 2021-12-19 ENCOUNTER — Encounter (HOSPITAL_COMMUNITY): Admission: AD | Disposition: A | Discharge status: Home / Self Care | Payer: Self-pay | Source: Home / Self Care

## 2021-12-19 ENCOUNTER — Inpatient Hospital Stay (HOSPITAL_COMMUNITY): Payer: Medicaid Other

## 2021-12-19 ENCOUNTER — Inpatient Hospital Stay (HOSPITAL_COMMUNITY): Payer: Medicaid Other | Admitting: Anesthesiology

## 2021-12-19 ENCOUNTER — Other Ambulatory Visit: Payer: Self-pay

## 2021-12-19 ENCOUNTER — Inpatient Hospital Stay (EMERGENCY_DEPARTMENT_HOSPITAL): Payer: Medicaid Other | Admitting: Anesthesiology

## 2021-12-19 ENCOUNTER — Inpatient Hospital Stay (HOSPITAL_COMMUNITY)
Admission: AD | Admit: 2021-12-19 | Discharge: 2021-12-19 | Disposition: A | Discharge status: Home / Self Care | Payer: Medicaid Other | Attending: Obstetrics and Gynecology | Admitting: Obstetrics and Gynecology

## 2021-12-19 ENCOUNTER — Encounter (HOSPITAL_COMMUNITY): Payer: Self-pay

## 2021-12-19 DIAGNOSIS — Z9889 Other specified postprocedural states: Secondary | ICD-10-CM

## 2021-12-19 DIAGNOSIS — O26891 Other specified pregnancy related conditions, first trimester: Secondary | ICD-10-CM | POA: Diagnosis present

## 2021-12-19 DIAGNOSIS — O219 Vomiting of pregnancy, unspecified: Secondary | ICD-10-CM | POA: Insufficient documentation

## 2021-12-19 DIAGNOSIS — Z3A01 Less than 8 weeks gestation of pregnancy: Secondary | ICD-10-CM | POA: Diagnosis not present

## 2021-12-19 DIAGNOSIS — K661 Hemoperitoneum: Secondary | ICD-10-CM

## 2021-12-19 DIAGNOSIS — O00102 Left tubal pregnancy without intrauterine pregnancy: Secondary | ICD-10-CM | POA: Diagnosis not present

## 2021-12-19 DIAGNOSIS — Z3689 Encounter for other specified antenatal screening: Secondary | ICD-10-CM | POA: Diagnosis not present

## 2021-12-19 DIAGNOSIS — R079 Chest pain, unspecified: Secondary | ICD-10-CM | POA: Diagnosis not present

## 2021-12-19 DIAGNOSIS — O009 Unspecified ectopic pregnancy without intrauterine pregnancy: Secondary | ICD-10-CM | POA: Diagnosis not present

## 2021-12-19 DIAGNOSIS — I10 Essential (primary) hypertension: Secondary | ICD-10-CM | POA: Diagnosis not present

## 2021-12-19 DIAGNOSIS — R0789 Other chest pain: Secondary | ICD-10-CM | POA: Diagnosis not present

## 2021-12-19 DIAGNOSIS — R102 Pelvic and perineal pain: Secondary | ICD-10-CM | POA: Diagnosis not present

## 2021-12-19 DIAGNOSIS — N736 Female pelvic peritoneal adhesions (postinfective): Secondary | ICD-10-CM | POA: Diagnosis not present

## 2021-12-19 HISTORY — PX: DIAGNOSTIC LAPAROSCOPY WITH REMOVAL OF ECTOPIC PREGNANCY: SHX6449

## 2021-12-19 LAB — CBC
HCT: 35.7 % — ABNORMAL LOW (ref 36.0–46.0)
Hemoglobin: 11.9 g/dL — ABNORMAL LOW (ref 12.0–15.0)
MCH: 29 pg (ref 26.0–34.0)
MCHC: 33.3 g/dL (ref 30.0–36.0)
MCV: 87.1 fL (ref 80.0–100.0)
Platelets: 184 10*3/uL (ref 150–400)
RBC: 4.1 MIL/uL (ref 3.87–5.11)
RDW: 14.2 % (ref 11.5–15.5)
WBC: 9.4 10*3/uL (ref 4.0–10.5)
nRBC: 0 % (ref 0.0–0.2)

## 2021-12-19 LAB — TYPE AND SCREEN
ABO/RH(D): O POS
Antibody Screen: NEGATIVE

## 2021-12-19 LAB — HCG, QUANTITATIVE, PREGNANCY: hCG, Beta Chain, Quant, S: 1727 m[IU]/mL — ABNORMAL HIGH (ref <5)

## 2021-12-19 SURGERY — LAPAROSCOPY, WITH ECTOPIC PREGNANCY SURGICAL TREATMENT
Anesthesia: General

## 2021-12-19 MED ORDER — ROCURONIUM BROMIDE 10 MG/ML (PF) SYRINGE
PREFILLED_SYRINGE | INTRAVENOUS | Status: AC
  Filled 2021-12-19: qty 10

## 2021-12-19 MED ORDER — ONDANSETRON HCL 4 MG/2ML IJ SOLN
INTRAMUSCULAR | Status: AC
  Filled 2021-12-19: qty 2

## 2021-12-19 MED ORDER — SILVER NITRATE-POT NITRATE 75-25 % EX MISC
CUTANEOUS | Status: AC
  Filled 2021-12-19: qty 20

## 2021-12-19 MED ORDER — ACETAMINOPHEN 10 MG/ML IV SOLN
INTRAVENOUS | Status: DC | PRN
  Administered 2021-12-19: 1000 mg via INTRAVENOUS

## 2021-12-19 MED ORDER — LACTATED RINGERS IV SOLN
INTRAVENOUS | Status: DC
Start: 2021-12-19 — End: 2021-12-19

## 2021-12-19 MED ORDER — METOCLOPRAMIDE HCL 5 MG/ML IJ SOLN
INTRAMUSCULAR | Status: AC
  Filled 2021-12-19: qty 2

## 2021-12-19 MED ORDER — FENTANYL CITRATE (PF) 250 MCG/5ML IJ SOLN
INTRAMUSCULAR | Status: DC | PRN
  Administered 2021-12-19 (×2): 50 ug via INTRAVENOUS
  Administered 2021-12-19: 25 ug via INTRAVENOUS
  Administered 2021-12-19: 50 ug via INTRAVENOUS
  Administered 2021-12-19: 25 ug via INTRAVENOUS
  Administered 2021-12-19: 50 ug via INTRAVENOUS

## 2021-12-19 MED ORDER — LACTATED RINGERS IV BOLUS
1000.0000 mL | Freq: Once | INTRAVENOUS | Status: AC
  Administered 2021-12-19: 1000 mL via INTRAVENOUS

## 2021-12-19 MED ORDER — LIDOCAINE 2% (20 MG/ML) 5 ML SYRINGE
INTRAMUSCULAR | Status: DC | PRN
  Administered 2021-12-19: 100 mg via INTRAVENOUS

## 2021-12-19 MED ORDER — ACETAMINOPHEN 10 MG/ML IV SOLN
INTRAVENOUS | Status: AC
  Filled 2021-12-19: qty 100

## 2021-12-19 MED ORDER — PROPOFOL 10 MG/ML IV BOLUS
INTRAVENOUS | Status: DC | PRN
  Administered 2021-12-19: 160 mg via INTRAVENOUS

## 2021-12-19 MED ORDER — DEXAMETHASONE SODIUM PHOSPHATE 10 MG/ML IJ SOLN
INTRAMUSCULAR | Status: AC
  Filled 2021-12-19: qty 1

## 2021-12-19 MED ORDER — DEXAMETHASONE SODIUM PHOSPHATE 10 MG/ML IJ SOLN
INTRAMUSCULAR | Status: DC | PRN
  Administered 2021-12-19: 10 mg via INTRAVENOUS

## 2021-12-19 MED ORDER — OXYCODONE-ACETAMINOPHEN 5-325 MG PO TABS: 1.0000 | ORAL_TABLET | Freq: Four times a day (QID) | ORAL | 0 refills | Status: DC | PRN

## 2021-12-19 MED ORDER — BUPIVACAINE HCL (PF) 0.25 % IJ SOLN
INTRAMUSCULAR | Status: AC
  Filled 2021-12-19: qty 30

## 2021-12-19 MED ORDER — KETOROLAC TROMETHAMINE 30 MG/ML IJ SOLN
INTRAMUSCULAR | Status: AC
  Filled 2021-12-19: qty 1

## 2021-12-19 MED ORDER — CHLORHEXIDINE GLUCONATE 0.12 % MT SOLN
15.0000 mL | Freq: Once | OROMUCOSAL | Status: AC
  Administered 2021-12-19: 15 mL via OROMUCOSAL
  Filled 2021-12-19: qty 15

## 2021-12-19 MED ORDER — ONDANSETRON 4 MG PO TBDP
4.0000 mg | ORAL_TABLET | Freq: Once | ORAL | Status: AC
  Administered 2021-12-19: 4 mg via ORAL
  Filled 2021-12-19: qty 1

## 2021-12-19 MED ORDER — KETOROLAC TROMETHAMINE 30 MG/ML IJ SOLN
INTRAMUSCULAR | Status: DC | PRN
  Administered 2021-12-19: 30 mg via INTRAVENOUS

## 2021-12-19 MED ORDER — ONDANSETRON HCL 4 MG/2ML IJ SOLN
INTRAMUSCULAR | Status: DC | PRN
  Administered 2021-12-19: 4 mg via INTRAVENOUS

## 2021-12-19 MED ORDER — LIDOCAINE 2% (20 MG/ML) 5 ML SYRINGE
INTRAMUSCULAR | Status: AC
  Filled 2021-12-19: qty 5

## 2021-12-19 MED ORDER — ACETAMINOPHEN 160 MG/5ML PO SOLN: 325.0000 mg | ORAL | Status: DC | PRN

## 2021-12-19 MED ORDER — OXYCODONE HCL 5 MG PO TABS
ORAL_TABLET | ORAL | Status: AC
  Filled 2021-12-19: qty 1

## 2021-12-19 MED ORDER — OXYCODONE HCL 5 MG PO TABS
5.0000 mg | ORAL_TABLET | Freq: Once | ORAL | Status: AC | PRN
  Administered 2021-12-19: 5 mg via ORAL

## 2021-12-19 MED ORDER — HYDROMORPHONE HCL 1 MG/ML IJ SOLN
1.0000 mg | INTRAMUSCULAR | Status: DC | PRN
  Administered 2021-12-19: 1 mg via INTRAVENOUS
  Filled 2021-12-19: qty 1

## 2021-12-19 MED ORDER — PHENYLEPHRINE HCL (PRESSORS) 10 MG/ML IV SOLN
INTRAVENOUS | Status: DC | PRN
  Administered 2021-12-19: 80 ug via INTRAVENOUS

## 2021-12-19 MED ORDER — OXYCODONE HCL 5 MG/5ML PO SOLN: 5.0000 mg | Freq: Once | ORAL | Status: AC | PRN

## 2021-12-19 MED ORDER — SUCCINYLCHOLINE CHLORIDE 200 MG/10ML IV SOSY
PREFILLED_SYRINGE | INTRAVENOUS | Status: AC
  Filled 2021-12-19: qty 10

## 2021-12-19 MED ORDER — SIMETHICONE 80 MG PO CHEW: 80.0000 mg | CHEWABLE_TABLET | Freq: Four times a day (QID) | ORAL | 0 refills | Status: DC | PRN

## 2021-12-19 MED ORDER — SUCCINYLCHOLINE CHLORIDE 200 MG/10ML IV SOSY
PREFILLED_SYRINGE | INTRAVENOUS | Status: DC | PRN
  Administered 2021-12-19: 140 mg via INTRAVENOUS

## 2021-12-19 MED ORDER — ACETAMINOPHEN 325 MG PO TABS: 325.0000 mg | ORAL_TABLET | ORAL | Status: DC | PRN

## 2021-12-19 MED ORDER — DOCUSATE SODIUM 100 MG PO CAPS: 100.0000 mg | ORAL_CAPSULE | Freq: Two times a day (BID) | ORAL | 0 refills | Status: DC

## 2021-12-19 MED ORDER — MEPERIDINE HCL 25 MG/ML IJ SOLN: 6.2500 mg | INTRAMUSCULAR | Status: DC | PRN

## 2021-12-19 MED ORDER — MIDAZOLAM HCL 5 MG/5ML IJ SOLN
INTRAMUSCULAR | Status: DC | PRN
  Administered 2021-12-19: 2 mg via INTRAVENOUS

## 2021-12-19 MED ORDER — MIDAZOLAM HCL 2 MG/2ML IJ SOLN
INTRAMUSCULAR | Status: AC
  Filled 2021-12-19: qty 2

## 2021-12-19 MED ORDER — METOCLOPRAMIDE HCL 5 MG/ML IJ SOLN
INTRAMUSCULAR | Status: DC | PRN
  Administered 2021-12-19: 10 mg via INTRAVENOUS

## 2021-12-19 MED ORDER — SUGAMMADEX SODIUM 200 MG/2ML IV SOLN
INTRAVENOUS | Status: DC | PRN
  Administered 2021-12-19: 225.8 mg via INTRAVENOUS

## 2021-12-19 MED ORDER — PHENYLEPHRINE 80 MCG/ML (10ML) SYRINGE FOR IV PUSH (FOR BLOOD PRESSURE SUPPORT)
PREFILLED_SYRINGE | INTRAVENOUS | Status: AC
  Filled 2021-12-19: qty 10

## 2021-12-19 MED ORDER — PROPOFOL 10 MG/ML IV BOLUS
INTRAVENOUS | Status: AC
  Filled 2021-12-19: qty 20

## 2021-12-19 MED ORDER — ORAL CARE MOUTH RINSE: 15.0000 mL | Freq: Once | OROMUCOSAL | Status: AC

## 2021-12-19 MED ORDER — FENTANYL CITRATE (PF) 100 MCG/2ML IJ SOLN: 25.0000 ug | INTRAMUSCULAR | Status: DC | PRN

## 2021-12-19 MED ORDER — ONDANSETRON HCL 4 MG/2ML IJ SOLN
4.0000 mg | Freq: Once | INTRAMUSCULAR | Status: AC | PRN
Start: 2021-12-19 — End: 2021-12-19
  Administered 2021-12-19: 4 mg via INTRAVENOUS

## 2021-12-19 MED ORDER — ROCURONIUM BROMIDE 10 MG/ML (PF) SYRINGE
PREFILLED_SYRINGE | INTRAVENOUS | Status: DC | PRN
  Administered 2021-12-19: 20 mg via INTRAVENOUS
  Administered 2021-12-19: 50 mg via INTRAVENOUS

## 2021-12-19 MED ORDER — FENTANYL CITRATE (PF) 250 MCG/5ML IJ SOLN
INTRAMUSCULAR | Status: AC
  Filled 2021-12-19: qty 5

## 2021-12-19 MED ORDER — IBUPROFEN 600 MG PO TABS: 600.0000 mg | ORAL_TABLET | Freq: Four times a day (QID) | ORAL | 3 refills | Status: DC | PRN

## 2021-12-19 MED ORDER — BUPIVACAINE HCL 0.5 % IJ SOLN
INTRAMUSCULAR | Status: DC | PRN
  Administered 2021-12-19: 30 mL

## 2021-12-19 SURGICAL SUPPLY — 48 items
ADH SKN CLS APL DERMABOND .7 (GAUZE/BANDAGES/DRESSINGS) ×1
APL SWBSTK 6 STRL LF DISP (MISCELLANEOUS) ×1
APPLICATOR COTTON TIP 6 STRL (MISCELLANEOUS) ×1 IMPLANT
APPLICATOR COTTON TIP 6IN STRL (MISCELLANEOUS) ×2 IMPLANT
BAG SPEC RTRVL LRG 6X4 10 (ENDOMECHANICALS)
BLADE SURG 15 STRL LF DISP TIS (BLADE) ×1 IMPLANT
BLADE SURG 15 STRL SS (BLADE) ×2
CABLE HIGH FREQUENCY MONO STRZ (ELECTRODE) IMPLANT
DEFOGGER SCOPE WARMER CLEARIFY (MISCELLANEOUS) ×2 IMPLANT
DERMABOND ADVANCED (GAUZE/BANDAGES/DRESSINGS) ×1
DERMABOND ADVANCED .7 DNX12 (GAUZE/BANDAGES/DRESSINGS) ×1 IMPLANT
DRSG OPSITE POSTOP 3X4 (GAUZE/BANDAGES/DRESSINGS) IMPLANT
DURAPREP 26ML APPLICATOR (WOUND CARE) ×2 IMPLANT
ELECT REM PT RETURN 9FT ADLT (ELECTROSURGICAL) ×2
ELECTRODE REM PT RTRN 9FT ADLT (ELECTROSURGICAL) ×1 IMPLANT
GLOVE BIOGEL PI IND STRL 7.0 (GLOVE) ×2 IMPLANT
GLOVE BIOGEL PI IND STRL 7.5 (GLOVE) ×2 IMPLANT
GLOVE BIOGEL PI INDICATOR 7.0 (GLOVE) ×2
GLOVE BIOGEL PI INDICATOR 7.5 (GLOVE) ×2
GLOVE SURG SS PI 7.0 STRL IVOR (GLOVE) ×2 IMPLANT
GOWN STRL REUS W/ TWL LRG LVL3 (GOWN DISPOSABLE) ×3 IMPLANT
GOWN STRL REUS W/TWL LRG LVL3 (GOWN DISPOSABLE) ×6
IRRIG SUCT STRYKERFLOW 2 WTIP (MISCELLANEOUS) ×2
IRRIGATION SUCT STRKRFLW 2 WTP (MISCELLANEOUS) IMPLANT
KIT TURNOVER KIT B (KITS) ×2 IMPLANT
LIGASURE VESSEL 5MM BLUNT TIP (ELECTROSURGICAL) ×1 IMPLANT
NS IRRIG 1000ML POUR BTL (IV SOLUTION) ×2 IMPLANT
PACK LAPAROSCOPY BASIN (CUSTOM PROCEDURE TRAY) ×2 IMPLANT
PACK TRENDGUARD 450 HYBRID PRO (MISCELLANEOUS) IMPLANT
PAD OB MATERNITY 4.3X12.25 (PERSONAL CARE ITEMS) ×2 IMPLANT
POUCH LAPAROSCOPIC INSTRUMENT (MISCELLANEOUS) ×2 IMPLANT
POUCH SPECIMEN RETRIEVAL 10MM (ENDOMECHANICALS) IMPLANT
PROTECTOR NERVE ULNAR (MISCELLANEOUS) ×4 IMPLANT
SCISSORS LAP 5X35 DISP (ENDOMECHANICALS) IMPLANT
SET TUBE SMOKE EVAC HIGH FLOW (TUBING) ×2 IMPLANT
SLEEVE ADV FIXATION 5X100MM (TROCAR) IMPLANT
SUT MON AB 4-0 PS1 27 (SUTURE) ×2 IMPLANT
SUT VICRYL 0 UR6 27IN ABS (SUTURE) ×2 IMPLANT
SYR 10ML LL (SYRINGE) ×2 IMPLANT
SYS BAG RETRIEVAL 10MM (BASKET) ×2
SYSTEM BAG RETRIEVAL 10MM (BASKET) IMPLANT
SYSTEM CARTER THOMASON II (TROCAR) ×1 IMPLANT
TOWEL GREEN STERILE FF (TOWEL DISPOSABLE) ×4 IMPLANT
TRAY FOLEY W/BAG SLVR 14FR (SET/KITS/TRAYS/PACK) ×2 IMPLANT
TRENDGUARD 450 HYBRID PRO PACK (MISCELLANEOUS)
TROCAR ADV FIXATION 5X100MM (TROCAR) IMPLANT
TROCAR BALLN 12MMX100 BLUNT (TROCAR) ×1 IMPLANT
WARMER LAPAROSCOPE (MISCELLANEOUS) ×2 IMPLANT

## 2021-12-19 NOTE — Anesthesia Procedure Notes (Signed)
Procedure Name: Intubation Date/Time: 12/19/2021 10:08 AM  Performed by: Cy Blamer, CRNAPre-anesthesia Checklist: Patient identified, Emergency Drugs available, Suction available and Patient being monitored Patient Re-evaluated:Patient Re-evaluated prior to induction Oxygen Delivery Method: Circle system utilized Preoxygenation: Pre-oxygenation with 100% oxygen Induction Type: IV induction, Rapid sequence and Cricoid Pressure applied Laryngoscope Size: Miller and 2 Grade View: Grade I Tube type: Oral Tube size: 7.0 mm Number of attempts: 1 Airway Equipment and Method: Bite block Placement Confirmation: ETT inserted through vocal cords under direct vision, positive ETCO2 and breath sounds checked- equal and bilateral Secured at: 21 cm Tube secured with: Tape Dental Injury: Teeth and Oropharynx as per pre-operative assessment

## 2021-12-19 NOTE — Op Note (Signed)
Operative Note   12/19/2021  PRE-OP DIAGNOSIS: Ruptured left fallopian tube ectopic. Hemoperitoneum.   POST-OP DIAGNOSIS: Same. Omental and pelvic adhesions   SURGEON: Surgeon(s) and Role:    * [place] Bing, MD - Primary ASSISTANT:   * Autry-Lott, Simone, DO - Assisting  ANESTHESIA: General and local  PROCEDURE: Laparoscopic left salpingectomy, lysis of adhesions less than 45 minutes  ESTIMATED BLOOD LOSS: 57mL. Approximately of hemoperitoneum  DRAINS: indwelling foley UOP   TOTAL IV FLUIDS: per anesthesia note  SPECIMENS: left fallopian tube ectopic   VTE PROPHYLAXIS: SCDs to the bilateral lower extremities  ANTIBIOTICS: not indicated  COMPLICATIONS: laparoscopic tower had to be replaced during the case  DISPOSITION: PACU - hemodynamically stable.  CONDITION: stable  FINDINGS: Exam under anesthesia revealed an anteverted uterus approximately 6 week size, normal shape, and no adnexal masses, normal EBGUS, no vaginal bleeding. Normal cervix and vagina. Laparoscopic survey of the abdomen revealed a grossly normal liver, gallbladder and stomach edge, normal right tube and ovary, normal left ovary and grossly normal uterus. At the left adnexa, there was an engorged left fallopian tube that was extruding clot and the tube was adhered to the right side of the uterus. Omental adhesions at the umbilical site.  PROCEDURE IN DETAIL: The patient was taken to the OR where anesthesia was administed. The patient was positioned in dorsal lithotomy in the [place] stirrups. The patient was then examined under anesthesia with the above noted findings. The patient was prepped and draped in the normal sterile fashion and foley catheter was placed. A Graves speculum was placed in the vagina and the anterior lip of the cervix was grasped with a single toothed tenaculum.  A Hulka uterine manipulator was then inserted in the uterus and uterine mobility was found to be satisfactory; the  speculum and tenaculum were then removed.  After changing gloves, attention was turned to the patient's abdomen where a 9mm skin incision was made in the umbilical fold, after injection of local anesthesia. Using the open technique, the abdomen was entered and the balloon trocar placed; pneumoperitoneum was then obtained. The operative laparoscope was introduced into the abdomen and I was able to go around the omental adhesions. The patient was then placed in Trendelenburg. A 24mm right and left lower quadrant port was then placed after injection of local anesthesia and under direct visualization. The omental adhesions were inspected and no bowel was noted. Using the Ligasure, The front and left side adhesions were serially cauterized and cut; the right and posterior adhesions were left in place.  Clots were then removed from the pelvis and abdomen and the pelvic anatomy was then visualized. Using the Ligasure, the left mesosalpinx was serially cauterized and cut and the tube transected at the cornua. The tube was then removed with the Endocatch bag. As much old clot and blood was then removed as possible. The operation site was then inspected at and no bleeding noted. The abdomen was reinsufflated to 14 and the [employer] device used to close the umbilical fascia with 0-vicryl through and through stitch. The left 34mm port was then removed under direct visualization and then the right mm port was then removed. The gas was released and then the umbilical fascia tied; the skin there was closed with 4-0 monocryl and dermabond. A 3-0 stitch was used to reapproximate the subcutaenous tissue at the LLQ port site; dermabond was used to close the 57mm port sites.  The Hulka was removed with no  bleeding noted from the cervix and all other instrumentation was removed from the vagina.  The foley catheter was removed. The patient tolerated the procedure well. All counts were correct x 2. The patient was  transferred to the recovery room awake, alert and breathing independently.   Cornelia Copa MD Attending Center for [employer] [facility])

## 2021-12-19 NOTE — Anesthesia Postprocedure Evaluation (Signed)
Anesthesia Post Note  Patient: Deanna Garcia  Procedure(s) Performed: DIAGNOSTIC LAPAROSCOPY WITH REMOVAL OF FALLOPIAN TUBE     Patient location during evaluation: PACU Anesthesia Type: General Level of consciousness: awake and alert Pain management: pain level controlled Vital Signs Assessment: post-procedure vital signs reviewed and stable Respiratory status: spontaneous breathing, nonlabored ventilation, respiratory function stable and patient connected to nasal cannula oxygen Cardiovascular status: blood pressure returned to baseline and stable Postop Assessment: no apparent nausea or vomiting Anesthetic complications: no   No notable events documented.  Last Vitals:  Vitals:   12/19/21 1235 12/19/21 1250  BP: 120/63 125/73  Pulse: 82 75  Resp: 14 11  Temp:  36.7 C  SpO2: 94% 98%    Last Pain:  Vitals:   12/19/21 1250  TempSrc:   PainSc: 4                  Jaylanie Boschee

## 2021-12-19 NOTE — Transfer of Care (Signed)
Immediate Anesthesia Transfer of Care Note  Patient: Deanna Garcia  Procedure(s) Performed: DIAGNOSTIC LAPAROSCOPY WITH REMOVAL OF FALLOPIAN TUBE  Patient Location: PACU  Anesthesia Type:General  Level of Consciousness: awake, alert  and oriented  Airway & Oxygen Therapy: Patient Spontanous Breathing  Post-op Assessment: Report given to RN, Post -op Vital signs reviewed and stable, Patient moving all extremities X 4 and Patient able to stick tongue midline  Post vital signs: Reviewed  Last Vitals:  Vitals Value Taken Time  BP 136/78 12/19/21 1205  Temp 97.5   Pulse 96 12/19/21 1207  Resp 15 12/19/21 1207  SpO2 97 % 12/19/21 1207  Vitals shown include unvalidated device data.  Last Pain:  Vitals:   12/19/21 0933  TempSrc: Oral  PainSc:          Complications: No notable events documented.

## 2021-12-19 NOTE — Anesthesia Preprocedure Evaluation (Addendum)
Anesthesia Evaluation  Patient identified by MRN, date of birth, ID band Patient awake    Reviewed: Allergy & Precautions, H&P , NPO status , Patient's Chart, lab work & pertinent test results, reviewed documented beta blocker date and time   Airway Mallampati: II  TM Distance: >3 FB Neck ROM: full    Dental no notable dental hx. (+) Teeth Intact, Dental Advisory Given   Pulmonary neg pulmonary ROS,    Pulmonary exam normal breath sounds clear to auscultation       Cardiovascular Exercise Tolerance: Good negative cardio ROS   Rhythm:regular Rate:Normal     Neuro/Psych  Headaches, Anxiety negative psych ROS   GI/Hepatic negative GI ROS, Neg liver ROS,   Endo/Other  negative endocrine ROS  Renal/GU negative Renal ROS  negative genitourinary   Musculoskeletal   Abdominal   Peds  Hematology negative hematology ROS (+)   Anesthesia Other Findings   Reproductive/Obstetrics (+) Pregnancy                            Anesthesia Physical Anesthesia Plan  ASA: 2 and emergent  Anesthesia Plan: General   Post-op Pain Management: Ofirmev IV (intra-op)* and Toradol IV (intra-op)*   Induction: Intravenous and Cricoid pressure planned  PONV Risk Score and Plan: 3 and Ondansetron, Dexamethasone and Treatment may vary due to age or medical condition  Airway Management Planned: Oral ETT  Additional Equipment: None  Intra-op Plan:   Post-operative Plan:   Informed Consent: I have reviewed the patients History and Physical, chart, labs and discussed the procedure including the risks, benefits and alternatives for the proposed anesthesia with the patient or authorized representative who has indicated his/her understanding and acceptance.     Dental Advisory Given  Plan Discussed with: CRNA and Anesthesiologist  Anesthesia Plan Comments:        Anesthesia Quick Evaluation

## 2021-12-19 NOTE — MAU Provider Note (Signed)
MAU Note  21 y/o G2P1001 with ruptured left ectopic pregnancy. Pmhx significant for h/o c/s x 1 and bmi low 30s. Pt had quant of 1500 on 7/24 and diagnosed with ectopic pregnancy and received mtx. Last u/s on 7/19 showed normal uterus, no GS/IUP and nothing in the adnexa.  Pt presented today with acute onset lower abdominal pain. Quant pending, hgb stable at 11.9 and u/s read just came back showing moderate FF in the belly, and left ectopic pregnancy  Pt still with discomfort but feels okay  Vitals:   12/19/21 0719 12/19/21 0800  BP: (!) 142/82 (!) 146/70  Pulse: 95 82  Resp: 17 20  Temp: 97.9 F (36.6 C)   TempSrc: Oral   SpO2: 100% 100%   No intake or output data in the 24 hours ending 12/19/21 0856   Current Vital Signs 24h Vital Sign Ranges  T 97.9 F (36.6 C) Temp  Avg: 97.9 F (36.6 C)  Min: 97.9 F (36.6 C)  Max: 97.9 F (36.6 C)  BP (!) 146/70 BP  Min: 142/82  Max: 146/70  HR 82 Pulse  Avg: 88.5  Min: 82  Max: 95  RR 20 Resp  Avg: 18.5  Min: 17  Max: 20  SaO2 100 % Room Air SpO2  Avg: 100 %  Min: 100 %  Max: 100 %       24 Hour I/O Current Shift I/O  Time Ins Outs No intake/output data recorded. No intake/output data recorded.   NAD Abdomen: obese, mildly ttp Normal s1 and s2, no mrgs Ctab     Latest Ref Rng & Units 12/19/2021    8:03 AM 12/17/2021    2:57 PM 12/14/2021    9:19 AM  CBC  WBC 4.0 - 10.5 K/uL 9.4  7.9  5.4   Hemoglobin 12.0 - 15.0 g/dL 60.6  30.1  60.1   Hematocrit 36.0 - 46.0 % 35.7  35.4  35.0   Platelets 150 - 400 K/uL 184  185  179    Pending: t&s, quant hcg  Radiology Narrative & Impression  CLINICAL DATA:  Possible ectopic pregnancy given methotrexate July 2023.   EXAM: OBSTETRIC <14 WK [place] AND TRANSVAGINAL OB US   TECHNIQUE: Both transabdominal and transvaginal ultrasound examinations were performed for complete evaluation of the gestation as well as the maternal uterus, adnexal regions, and pelvic cul-de-sac. Transvaginal  technique was performed to assess early pregnancy.   COMPARISON:  December 12, 2021 ultrasound.   FINDINGS: Intrauterine gestational sac: None   Yolk sac:  Not Visualized.   Embryo:  Not Visualized.   Cardiac Activity: Not Visualized.   Subchorionic hemorrhage:  None visualized.   Maternal uterus/adnexae: Moderate volume of complex pelvic free fluid. Soft tissue structure in the left adnexa measures 4.9 x 2.8 x 3.7 cm favored to reflect the left ovary.   IMPRESSION: Moderate volume of complex pelvic free fluid without intrauterine pregnancy identified. While there is no definite ectopic pregnancy identified the complex pelvic free fluid is concerning for ruptured ectopic pregnancy.   These results were called by telephone at the time of interpretation on 12/19/2021 at 8:46 am to provider Reita Cliche, who verbally acknowledged these results.     Electronically Signed   By: Maudry Mayhew M.D.   On: 12/19/2021 08:50   A/p: pt with ruptured ectopic D/w pt re: status and recommend laparoscopic removal of affected fallopian tube. D/w her that should be able to go home later today. She has been npo  since before [facility] OR called for urgent l/s unilateral salpingectomy and they will call me back with time  Cornelia Copa MD Attending Center for [facility] (Faculty Practice) [phone] 12/19/2021 Time: [phone]

## 2021-12-19 NOTE — MAU Note (Addendum)
Deanna Garcia is a 21 y.o. at [redacted]w[redacted]d here in MAU reporting: pain in lower abd woke her at 0300, sharp and constant.  Bleeding has been like a period. Pt received first dose of MTX on Monday. Passed one nickel sized clot this morning. C/o n/v- was given zofran in ER. Approx 100cc fluid in bag.  Onset of complaint: 0300 Pain score: 8 Vitals:   12/19/21 0719  BP: (!) 142/82  Pulse: 95  Resp: 17  Temp: 97.9 F (36.6 C)  SpO2: 100%     Lab orders placed from triage:

## 2021-12-19 NOTE — ED Triage Notes (Signed)
Pt reports ectopic pregnancy on Monday that she had an injection for. Pt woke up at 3am with severe lower abd pain with n/v. Denies any heavy vaginal bleeding.

## 2021-12-19 NOTE — ED Provider Triage Note (Signed)
Emergency Medicine Provider OB Triage Evaluation Note  Deanna Garcia is a 21 y.o. female, G2P1001, at [redacted]w[redacted]d gestation who presents to the emergency department with complaints of pelvic pain. Had ectopic pregnancy 3 days ago, started in methotrexate.  Here with worsening pelvic pain, nausea and vomiting.  No fever, dizzy, lighthead, dysuris  Review of  Systems  Positive: as above Negative: as above  Physical Exam  BP (!) 142/82 (BP Location: Left Arm)   Pulse 95   Temp 97.9 F (36.6 C) (Oral)   Resp 17   LMP 11/02/2021 (Exact Date)   SpO2 100%  General: Awake, uncomfortable HEENT: Atraumatic  Resp: Normal effort  Cardiac: Normal rate Abd: TTP LLQ MSK: Moves all extremities without difficulty Neuro: Speech clear  Medical Decision Making  Pt evaluated for pregnancy concern and is stable for transfer to MAU. Pt is in agreement with plan for transfer.  7:39 AM Discussed with MAU APP, Hilda Lias, who accepts patient in transfer.  Clinical Impression  No diagnosis found.     Fayrene Helper, PA-C 12/19/21 [id number]

## 2021-12-19 NOTE — Discharge Instructions (Addendum)
Laparoscopic Surgery Discharge Instructions  Instructions Following Major Laparoscopic Surgery You have just undergone a major laparoscopic surgery.  The following list should answer your most common questions.  Although we will discuss your surgery and post-operative instructions with you prior to your discharge, this list will serve as a reminder if you fail to recall the details of what we discussed.  We will discuss your surgery once again in detail at your post-op visit in two to four weeks. If you haven't already done so, please call to make your appointment as soon as possible.  How you will feel: Although you have just undergone a major surgery, your recovery will be significantly shorter since the surgery was performed through much smaller incisions than the traditional approach.  You should feel slightly better each day.  If you suddenly feel much worse than the prior day, please call the clinic.  It's important during the early part of your recovery that you maintain some activity.  Walking is encouraged.  You will quicken your recovery by continued activity.  Incision:  Your incisions will be closed with dissolvable stitches or surgical adhesive (glue).  There may be Band-aids and/or Steri-strips covering your incisions.  If there is no drainage from the incisions you may remove the Band-aids in one to two days.  You may notice some minor bruising at the incision sites.  This is common and will resolve within several days.  Please inform us if the redness at the edges of your incision appears to be spreading.  If the skin around your incision becomes warm to the touch, or if you notice a pus-like drainage, please call the office.  Stairs/Driving/Activities: You may climb stairs if necessary.  If you've had general anesthesia, do not drive a car the rest of the day today.  You may begin light housework when you feel up to it, but avoid heavy lifting (more than 15-20lbs) or pushing until cleared  for these activities by your physician.  Hygiene:  Do not soak your incisions.  Showers are acceptable but you may not take a bath or swim in a pool.  Cleanse your incisions daily with soap and water.  Medications:  Please resume taking any medications that you were taking prior to the surgery.  If we have prescribed any new medications for you, please take them as directed.  Constipation:  It is fairly common to experience some difficulty in moving your bowels following major surgery.  Being active will help to reduce this likelihood. A diet rich in fiber and plenty of liquids is desirable.  If you do become constipated, a mild laxative such as Miralax, Milk of Magnesia, or Metamucil, or a stool softener such as Colace, is recommended.  General Instructions: If you develop a fever of 100.5 degrees or higher, please call the office number(s) below for physician on call.

## 2021-12-19 NOTE — MAU Provider Note (Addendum)
Chief Complaint: Abdominal Pain   Event Date/Time   First Provider Initiated Contact with Patient 12/19/21 0802        SUBJECTIVE HPI: Deanna Garcia is a 21 y.o. G2P1001 at [redacted]w[redacted]d by LMP who presents from Main ED to maternity admissions reporting onset of severe pain.at 0300 this morning.  She also vomited 3 times and had one episode of diarrhea.  She denies vaginal bleeding, vaginal itching/burning, urinary symptoms, h/a, dizziness, or fever/chills.    She was seen in MAU 2 days ago with abnormal increase in Quant HCG levels.  Pregnancy was assessed to be a presumed ectopic pregnancy and she was treated with Methotrexate.   Abdominal Pain This is a recurrent problem. The current episode started today. The problem occurs constantly. The most recent episode lasted 5 hours. The pain is located in the suprapubic region, LLQ and RLQ. The quality of the pain is sharp. The abdominal pain does not radiate. Associated symptoms include diarrhea. Pertinent negatives include no constipation, dysuria, fever, frequency or myalgias. The pain is aggravated by palpation. The pain is relieved by Nothing. She has tried nothing for the symptoms.   RN Note: Deanna Garcia is a 21 y.o. at [redacted]w[redacted]d here in MAU reporting: pain in lower abd woke her at 0300, sharp and constant.  Bleeding has been like a period. Pt received first dose of MTX on Monday. Passed one nickel sized clot this morning. C/o n/v- was given zofran in ER. Approx 100cc fluid in bag.   Past Medical History:  Diagnosis Date   Anxiety    Asthma    GERD (gastroesophageal reflux disease)    Migraines    Past Surgical History:  Procedure Laterality Date   CESAREAN SECTION     Social History   Socioeconomic History   Marital status: Single    Spouse name: Not on file   Number of children: Not on file   Years of education: Not on file   Highest education level: Not on file  Occupational History   Not on file  Tobacco Use   Smoking status:  Never    Passive exposure: Yes   Smokeless tobacco: Never  Substance and Sexual Activity   Alcohol use: No   Drug use: Not Currently    Types: Marijuana   Sexual activity: Yes  Other Topics Concern   Not on file  Social History Narrative   Not on file   Social Determinants of Health   Financial Resource Strain: Not on file  Food Insecurity: Not on file  Transportation Needs: Not on file  Physical Activity: Not on file  Stress: Not on file  Social Connections: Not on file  Intimate Partner Violence: Not on file   No current facility-administered medications on file prior to encounter.   Current Outpatient Medications on File Prior to Encounter  Medication Sig Dispense Refill   acetaminophen (TYLENOL) 325 MG tablet Take 650 mg by mouth every 6 (six) hours as needed. For pain     albuterol (PROVENTIL HFA;VENTOLIN HFA) 108 (90 BASE) MCG/ACT inhaler Inhale 2 puffs into the lungs every 6 (six) hours as needed. For shortness of breath/wheezing     metoCLOPramide (REGLAN) 10 MG tablet Take 1 tablet (10 mg total) by mouth every 6 (six) hours. 30 tablet 0   Allergies  Allergen Reactions   Penicillins Hives   Amoxicillin Rash    I have reviewed patient's Past Medical Hx, Surgical Hx, Family Hx, Social Hx, medications and allergies.  ROS:  Review of Systems  Constitutional:  Negative for fever.  Gastrointestinal:  Positive for abdominal pain and diarrhea. Negative for constipation.  Genitourinary:  Negative for dysuria and frequency.  Musculoskeletal:  Negative for myalgias.   Review of Systems  Other systems negative   Physical Exam  Physical Exam Vitals and nursing note reviewed. Exam conducted with a chaperone present.  Constitutional:      Appearance: She is well-developed. She is ill-appearing.  Abdominal:     Tenderness: There is generalized abdominal tenderness.  Neurological:     Mental Status: She is alert.    Patient Vitals for the past 24 hrs:  BP Temp  Temp src Pulse Resp SpO2  12/19/21 0800 (!) 146/70 -- -- 82 20 100 %  12/19/21 0719 (!) 142/82 97.9 F (36.6 C) Oral 95 17 100 %   Constitutional: Well-developed, well-nourished female in no acute distress.  Cardiovascular: normal rate Respiratory: normal effort GI: Abd soft, non-tender. Pos BS x 4 MS: Extremities nontender, no edema, normal ROM Neurologic: Alert and oriented x 4.  GU: Neg CVAT.  PELVIC EXAM: Deferred in lieu of transvaginal ultrasound  LAB RESULTS CBC and Quant HCG ordered and drawn  --/--/O POS (07/19 1404)  IMAGING Transvaginal ultrasound ordered  MAU Management/MDM: I have reviewed the triage vital signs and the nursing notes.   Pertinent labs & imaging results that were available during my care of the patient were reviewed by me and considered in my medical decision making (see chart for details).      I have reviewed her medical records including past results, notes and treatments.   Ordered CBC and Quantitative HCG (drawn)   Pelvic exam deferred due to recent Methotrexate. Will check Ultrasound to rule out ruptured ectopic.  This bleeding/pain can represent a normal pregnancy with bleeding, spontaneous abortion or even an ectopic which can be life-threatening.  The process as listed above helps to determine which of these is present.   ASSESSMENT Pregnancy at [redacted]w[redacted]d by LMP Ectopic pregnancy New onset of post-Methotrexate lower abdominal pain.  PLAN Care turned over to Deanna Garcia CNM who assumes care  Deanna Garcia CNM, MSN Certified Nurse-Midwife 12/19/2021  8:05 AM  US OB Transvaginal  Result Date: 12/19/2021 CLINICAL DATA:  Possible ectopic pregnancy given methotrexate July 2023. EXAM: OBSTETRIC <14 WK [place] AND TRANSVAGINAL OB US TECHNIQUE: Both transabdominal and transvaginal ultrasound examinations were performed for complete evaluation of the gestation as well as the maternal uterus, adnexal regions, and pelvic cul-de-sac. Transvaginal  technique was performed to assess early pregnancy. COMPARISON:  December 12, 2021 ultrasound. FINDINGS: Intrauterine gestational sac: None Yolk sac:  Not Visualized. Embryo:  Not Visualized. Cardiac Activity: Not Visualized. Subchorionic hemorrhage:  None visualized. Maternal uterus/adnexae: Moderate volume of complex pelvic free fluid. Soft tissue structure in the left adnexa measures 4.9 x 2.8 x 3.7 cm favored to reflect the left ovary. IMPRESSION: Moderate volume of complex pelvic free fluid without intrauterine pregnancy identified. While there is no definite ectopic pregnancy identified the complex pelvic free fluid is concerning for ruptured ectopic pregnancy. These results were called by telephone at the time of interpretation on 12/19/2021 at 8:46 am to provider Reita Cliche, who verbally acknowledged these results. Electronically Signed   By: Maudry Mayhew M.D.   On: 12/19/2021 08:50    --[place] confirms ruptured ectopic --Dr. Vergie Living inbound to unit to consent patient  --Per Dr. Vergie Living, prep for OR  Deanna Garcia, MSA, MSN, CNM Certified Nurse Midwife, [facility]  for [employer], [place]

## 2021-12-20 ENCOUNTER — Encounter (HOSPITAL_COMMUNITY): Payer: Self-pay | Admitting: Obstetrics and Gynecology

## 2021-12-20 ENCOUNTER — Telehealth: Payer: Self-pay

## 2021-12-20 ENCOUNTER — Ambulatory Visit: Payer: Self-pay

## 2021-12-20 LAB — SURGICAL PATHOLOGY

## 2021-12-20 NOTE — Telephone Encounter (Signed)
Pt left VM on nurse line stating her medications were not available at pharmacy. Called pharmacy. Percocet, Colace, and ibuprofen are available for pick up. Gas-X not available due to pharmacy not carrying OTC meds. Called pt to review.

## 2021-12-20 NOTE — Telephone Encounter (Signed)
Patient scheduled for day 4 MTX beta HCG in office this AM. Per chart review pt had ectopic removal yesterday. Called pt to cancel appt and review upcoming post op.

## 2021-12-25 DIAGNOSIS — Z419 Encounter for procedure for purposes other than remedying health state, unspecified: Secondary | ICD-10-CM | POA: Diagnosis not present

## 2022-01-09 ENCOUNTER — Other Ambulatory Visit: Payer: Self-pay

## 2022-01-09 ENCOUNTER — Ambulatory Visit (INDEPENDENT_AMBULATORY_CARE_PROVIDER_SITE_OTHER): Payer: Medicaid Other | Admitting: Obstetrics and Gynecology

## 2022-01-09 ENCOUNTER — Other Ambulatory Visit (HOSPITAL_COMMUNITY)
Admission: RE | Admit: 2022-01-09 | Discharge: 2022-01-09 | Disposition: A | Discharge status: Home / Self Care | Payer: Medicaid Other | Source: Ambulatory Visit | Attending: Obstetrics and Gynecology | Admitting: Obstetrics and Gynecology

## 2022-01-09 VITALS — BP 134/76 | HR 75 | Wt 241.0 lb

## 2022-01-09 DIAGNOSIS — Z124 Encounter for screening for malignant neoplasm of cervix: Secondary | ICD-10-CM | POA: Diagnosis not present

## 2022-01-09 DIAGNOSIS — Z8759 Personal history of other complications of pregnancy, childbirth and the puerperium: Secondary | ICD-10-CM

## 2022-01-09 DIAGNOSIS — Z09 Encounter for follow-up examination after completed treatment for conditions other than malignant neoplasm: Secondary | ICD-10-CM

## 2022-01-09 NOTE — Progress Notes (Signed)
Obstetrics and Gynecology Visit Return Patient Evaluation  Appointment Date: 01/09/2022  Primary Care Provider: [facility]: [facility] for [facility]   Chief Complaint: post op check  History of Present Illness:  Deanna Garcia is a 21 y.o. s/p 7/26 l/s Left salpingectomy for ruptured ectopic. Pt discharged to home from the pacu. Final path benign  Interval History: Since that time, she states that she is doing well. No period yet.  Review of Systems: as noted in the History of Present Illness.   Medications:  Tyrone Sage. Bencivenga had no medications administered during this visit. Current Outpatient Medications  Medication Sig Dispense Refill   acetaminophen (TYLENOL) 325 MG tablet Take 650 mg by mouth every 6 (six) hours as needed. For pain (Patient not taking: Reported on 01/09/2022)     albuterol (PROVENTIL HFA;VENTOLIN HFA) 108 (90 BASE) MCG/ACT inhaler Inhale 2 puffs into the lungs every 6 (six) hours as needed. For shortness of breath/wheezing (Patient not taking: Reported on 01/09/2022)     docusate sodium (COLACE) 100 MG capsule Take 1 capsule (100 mg total) by mouth 2 (two) times daily. (Patient not taking: Reported on 01/09/2022) 10 capsule 0   ibuprofen (ADVIL) 600 MG tablet Take 1 tablet (600 mg total) by mouth every 6 (six) hours as needed. (Patient not taking: Reported on 01/09/2022) 60 tablet 3   metoCLOPramide (REGLAN) 10 MG tablet Take 1 tablet (10 mg total) by mouth every 6 (six) hours. (Patient not taking: Reported on 01/09/2022) 30 tablet 0   oxyCODONE-acetaminophen (PERCOCET/ROXICET) 5-325 MG tablet Take 1 tablet by mouth every 6 (six) hours as needed. (Patient not taking: Reported on 01/09/2022) 10 tablet 0   simethicone (GAS-X) 80 MG chewable tablet Chew 1 tablet (80 mg total) by mouth every 6 (six) hours as needed for flatulence. (Patient not taking: Reported on 01/09/2022) 30 tablet 0   No current facility-administered medications  for this visit.    Allergies: is allergic to penicillins and amoxicillin.  Physical Exam:  BP 134/76   Pulse 75   Wt 241 lb (109.3 kg)   LMP 11/02/2021 (Exact Date)   BMI 36.64 kg/m  Body mass index is 36.64 kg/m. General appearance: Well nourished, well developed female in no acute distress.  Abdomen: diffusely non tender to palpation, non distended, and no masses, hernias. Well healed l/s port sites Neuro/Psych:  Normal mood and affect.    Pelvic exam:  EGBUS: wnl Vaginal vault: wnl Cervix: wnl Bimanual: neg   Assessment: pt doing well  Plan:  1. Cervical cancer screening - Cytology - PAP( [place])  2. Postop check No issues. Declines anything for birth control. Pt had qmonth, regular periods beforehand so I told her that should resume. Pt told to let us know if she'd like anything for [facility] in the future. Pt not wanting to try again but I told her that she can try again when she has another period and to let us know if she ever has a +UPT in the future as she will need early betas and u/s to see if she has an IUP   RTC: PRN  Cornelia Copa MD Attending Center for [employer] [facility])

## 2022-01-10 LAB — CYTOLOGY - PAP: Diagnosis: NEGATIVE

## 2022-01-25 DIAGNOSIS — Z419 Encounter for procedure for purposes other than remedying health state, unspecified: Secondary | ICD-10-CM | POA: Diagnosis not present

## 2022-02-24 DIAGNOSIS — Z419 Encounter for procedure for purposes other than remedying health state, unspecified: Secondary | ICD-10-CM | POA: Diagnosis not present

## 2022-03-27 DIAGNOSIS — Z419 Encounter for procedure for purposes other than remedying health state, unspecified: Secondary | ICD-10-CM | POA: Diagnosis not present

## 2022-04-26 DIAGNOSIS — Z419 Encounter for procedure for purposes other than remedying health state, unspecified: Secondary | ICD-10-CM | POA: Diagnosis not present

## 2022-05-27 DIAGNOSIS — Z419 Encounter for procedure for purposes other than remedying health state, unspecified: Secondary | ICD-10-CM | POA: Diagnosis not present

## 2022-06-27 DIAGNOSIS — Z419 Encounter for procedure for purposes other than remedying health state, unspecified: Secondary | ICD-10-CM | POA: Diagnosis not present

## 2022-07-26 DIAGNOSIS — Z419 Encounter for procedure for purposes other than remedying health state, unspecified: Secondary | ICD-10-CM | POA: Diagnosis not present

## 2022-08-26 DIAGNOSIS — Z419 Encounter for procedure for purposes other than remedying health state, unspecified: Secondary | ICD-10-CM | POA: Diagnosis not present

## 2022-09-25 DIAGNOSIS — Z419 Encounter for procedure for purposes other than remedying health state, unspecified: Secondary | ICD-10-CM | POA: Diagnosis not present

## 2022-10-26 DIAGNOSIS — Z419 Encounter for procedure for purposes other than remedying health state, unspecified: Secondary | ICD-10-CM | POA: Diagnosis not present

## 2022-11-25 DIAGNOSIS — Z419 Encounter for procedure for purposes other than remedying health state, unspecified: Secondary | ICD-10-CM | POA: Diagnosis not present

## 2022-12-25 ENCOUNTER — Encounter (HOSPITAL_COMMUNITY): Payer: Self-pay

## 2022-12-25 ENCOUNTER — Inpatient Hospital Stay (HOSPITAL_COMMUNITY): Payer: Medicaid Other

## 2022-12-25 ENCOUNTER — Inpatient Hospital Stay (HOSPITAL_COMMUNITY)
Admission: AD | Admit: 2022-12-25 | Discharge: 2022-12-25 | Disposition: A | Discharge status: Home / Self Care | Payer: Medicaid Other | Attending: Obstetrics and Gynecology | Admitting: Obstetrics and Gynecology

## 2022-12-25 DIAGNOSIS — O26891 Other specified pregnancy related conditions, first trimester: Secondary | ICD-10-CM | POA: Insufficient documentation

## 2022-12-25 DIAGNOSIS — R1030 Lower abdominal pain, unspecified: Secondary | ICD-10-CM | POA: Insufficient documentation

## 2022-12-25 DIAGNOSIS — O99611 Diseases of the digestive system complicating pregnancy, first trimester: Secondary | ICD-10-CM | POA: Diagnosis not present

## 2022-12-25 DIAGNOSIS — O210 Mild hyperemesis gravidarum: Secondary | ICD-10-CM | POA: Insufficient documentation

## 2022-12-25 DIAGNOSIS — Z3A01 Less than 8 weeks gestation of pregnancy: Secondary | ICD-10-CM | POA: Diagnosis not present

## 2022-12-25 DIAGNOSIS — R109 Unspecified abdominal pain: Secondary | ICD-10-CM | POA: Diagnosis not present

## 2022-12-25 DIAGNOSIS — K219 Gastro-esophageal reflux disease without esophagitis: Secondary | ICD-10-CM | POA: Insufficient documentation

## 2022-12-25 DIAGNOSIS — Z3490 Encounter for supervision of normal pregnancy, unspecified, unspecified trimester: Secondary | ICD-10-CM

## 2022-12-25 DIAGNOSIS — O26899 Other specified pregnancy related conditions, unspecified trimester: Secondary | ICD-10-CM

## 2022-12-25 LAB — URINALYSIS, ROUTINE W REFLEX MICROSCOPIC
Bilirubin Urine: NEGATIVE
Glucose, UA: NEGATIVE mg/dL
Hgb urine dipstick: NEGATIVE
Ketones, ur: NEGATIVE mg/dL
Leukocytes,Ua: NEGATIVE
Nitrite: NEGATIVE
Protein, ur: 30 mg/dL — AB
Specific Gravity, Urine: 1.016 (ref 1.005–1.030)
pH: 7 (ref 5.0–8.0)

## 2022-12-25 LAB — CBC
HCT: 36.5 % (ref 36.0–46.0)
Hemoglobin: 11.9 g/dL — ABNORMAL LOW (ref 12.0–15.0)
MCH: 29 pg (ref 26.0–34.0)
MCHC: 32.6 g/dL (ref 30.0–36.0)
MCV: 89 fL (ref 80.0–100.0)
Platelets: 225 10*3/uL (ref 150–400)
RBC: 4.1 MIL/uL (ref 3.87–5.11)
RDW: 13.5 % (ref 11.5–15.5)
WBC: 6.8 10*3/uL (ref 4.0–10.5)
nRBC: 0 % (ref 0.0–0.2)

## 2022-12-25 LAB — WET PREP, GENITAL
Clue Cells Wet Prep HPF POC: NONE SEEN
Sperm: NONE SEEN
Trich, Wet Prep: NONE SEEN
WBC, Wet Prep HPF POC: <10 (ref <10)
Yeast Wet Prep HPF POC: NONE SEEN

## 2022-12-25 LAB — HCG, QUANTITATIVE, PREGNANCY: hCG, Beta Chain, Quant, S: 73456 m[IU]/mL — ABNORMAL HIGH (ref <5)

## 2022-12-25 LAB — POCT PREGNANCY, URINE: Preg Test, Ur: POSITIVE — AB

## 2022-12-25 LAB — HIV ANTIBODY (ROUTINE TESTING W REFLEX): HIV Screen 4th Generation wRfx: NONREACTIVE

## 2022-12-25 MED ORDER — PROMETHAZINE HCL 25 MG PO TABS: 25.0000 mg | ORAL_TABLET | Freq: Four times a day (QID) | ORAL | 0 refills | Status: DC | PRN

## 2022-12-25 MED ORDER — ONDANSETRON 4 MG PO TBDP: 4.0000 mg | ORAL_TABLET | Freq: Three times a day (TID) | ORAL | 0 refills | Status: DC | PRN

## 2022-12-25 MED ORDER — PANTOPRAZOLE SODIUM 40 MG PO TBEC: 40.0000 mg | DELAYED_RELEASE_TABLET | Freq: Every day | ORAL | 0 refills | Status: DC

## 2022-12-25 NOTE — MAU Provider Note (Signed)
History     CSN: [id number]  Arrival date and time: 12/25/22 6433   Event Date/Time   First Provider Initiated Contact with Patient 12/25/22 1136      Chief Complaint  Patient presents with   Abdominal Pain   Possible Pregnancy   Deanna Garcia is a 22 y.o. year old G92P1011 female at [redacted]w[redacted]d weeks gestation who presents to MAU reporting (+) HPT yesterday and lower intermittent abdominal pain; rated 2/10. She reports N/V x 4 days usually in the mornings and evenings; no nausea today. She also reports she has reflux. She is concerned, because she had a ruptured ectopic in 11/2021.   OB History     Gravida  3   Para  1   Term  1   Preterm  0   AB  1   Living  1      SAB  0   IAB  0   Ectopic  1   Multiple  0   Live Births  1        Obstetric Comments  "Bones were too small"         Past Medical History:  Diagnosis Date   Anxiety    Asthma    GERD (gastroesophageal reflux disease)    Migraines     Past Surgical History:  Procedure Laterality Date   CESAREAN SECTION     DIAGNOSTIC LAPAROSCOPY WITH REMOVAL OF ECTOPIC PREGNANCY N/A 12/19/2021   Procedure: DIAGNOSTIC LAPAROSCOPY WITH REMOVAL OF FALLOPIAN TUBE;  Surgeon: Frontenac Bing, MD;  Location: [facility] OR;  Service: Gynecology;  Laterality: N/A;    Family History  Problem Relation Age of Onset   COPD Mother    Healthy Father     Social History   Tobacco Use   Smoking status: Never    Passive exposure: Yes   Smokeless tobacco: Never  Vaping Use   Vaping status: Never Used  Substance Use Topics   Alcohol use: No   Drug use: Not Currently    Types: Marijuana    Allergies:  Allergies  Allergen Reactions   Penicillins Hives   Amoxicillin Rash    Medications Prior to Admission  Medication Sig Dispense Refill Last Dose   acetaminophen (TYLENOL) 325 MG tablet Take 650 mg by mouth every 6 (six) hours as needed. For pain (Patient not taking: Reported on 01/09/2022)      albuterol  (PROVENTIL HFA;VENTOLIN HFA) 108 (90 BASE) MCG/ACT inhaler Inhale 2 puffs into the lungs every 6 (six) hours as needed. For shortness of breath/wheezing (Patient not taking: Reported on 01/09/2022)      docusate sodium (COLACE) 100 MG capsule Take 1 capsule (100 mg total) by mouth 2 (two) times daily. (Patient not taking: Reported on 01/09/2022) 10 capsule 0    ibuprofen (ADVIL) 600 MG tablet Take 1 tablet (600 mg total) by mouth every 6 (six) hours as needed. (Patient not taking: Reported on 01/09/2022) 60 tablet 3    metoCLOPramide (REGLAN) 10 MG tablet Take 1 tablet (10 mg total) by mouth every 6 (six) hours. (Patient not taking: Reported on 01/09/2022) 30 tablet 0    oxyCODONE-acetaminophen (PERCOCET/ROXICET) 5-325 MG tablet Take 1 tablet by mouth every 6 (six) hours as needed. (Patient not taking: Reported on 01/09/2022) 10 tablet 0    simethicone (GAS-X) 80 MG chewable tablet Chew 1 tablet (80 mg total) by mouth every 6 (six) hours as needed for flatulence. (Patient not taking: Reported on 01/09/2022) 30 tablet 0  Review of Systems  Constitutional: Negative.   HENT: Negative.    Eyes: Negative.   Respiratory: Negative.    Cardiovascular: Negative.   Gastrointestinal:  Negative for nausea and vomiting.  Endocrine: Negative.   Genitourinary:  Positive for pelvic pain.  Musculoskeletal: Negative.   Skin: Negative.   Allergic/Immunologic: Negative.   Neurological: Negative.   Hematological: Negative.   Psychiatric/Behavioral: Negative.     Physical Exam   Blood pressure 123/72, pulse 75, temperature 99 F (37.2 C), temperature source Oral, resp. rate 18, height 5\' 8"  (1.727 m), weight 106.1 kg, last menstrual period 11/12/2022, SpO2 99%, not currently breastfeeding.  Physical Exam Constitutional:      Appearance: Normal appearance. She is obese.  Cardiovascular:     Rate and Rhythm: Normal rate.  Pulmonary:     Effort: Pulmonary effort is normal.  Abdominal:     Palpations:  Abdomen is soft.  Genitourinary:    Comments: Swabs collected by patient using blind swab technique  Musculoskeletal:        General: Normal range of motion.  Skin:    General: Skin is warm and dry.  Neurological:     Mental Status: She is alert and oriented to person, place, and time.  Psychiatric:        Mood and Affect: Mood normal.        Behavior: Behavior normal.        Thought Content: Thought content normal.        Judgment: Judgment normal.    MAU Course  Procedures  MDM CCUA UPT CBC ABO/Rh -- not drawn  known O POS HCG Wet Prep GC/CT -- pending HIV -- pending OB < 14 wks [place] with TV  Results for orders placed or performed during the hospital encounter of 12/25/22 (from the past 24 hour(s))  Pregnancy, urine POC     Status: Abnormal   Collection Time: 12/25/22 10:31 AM  Result Value Ref Range   Preg Test, Ur POSITIVE (A) NEGATIVE  Urinalysis, Routine w reflex microscopic -Urine, Clean Catch     Status: Abnormal   Collection Time: 12/25/22 10:34 AM  Result Value Ref Range   Color, Urine YELLOW YELLOW   APPearance CLEAR CLEAR   Specific Gravity, Urine 1.016 1.005 - 1.030   pH 7.0 5.0 - 8.0   Glucose, UA NEGATIVE NEGATIVE mg/dL   Hgb urine dipstick NEGATIVE NEGATIVE   Bilirubin Urine NEGATIVE NEGATIVE   Ketones, ur NEGATIVE NEGATIVE mg/dL   Protein, ur 30 (A) NEGATIVE mg/dL   Nitrite NEGATIVE NEGATIVE   Leukocytes,Ua NEGATIVE NEGATIVE   RBC / HPF 0-5 0 - 5 RBC/hpf   WBC, UA 0-5 0 - 5 WBC/hpf   Bacteria, UA RARE (A) NONE SEEN   Squamous Epithelial / HPF 6-10 0 - 5 /HPF   Mucus PRESENT   Wet prep, genital     Status: None   Collection Time: 12/25/22 11:26 AM   Specimen: Vaginal  Result Value Ref Range   Yeast Wet Prep HPF POC NONE SEEN NONE SEEN   Trich, Wet Prep NONE SEEN NONE SEEN   Clue Cells Wet Prep HPF POC NONE SEEN NONE SEEN   WBC, Wet Prep HPF POC <10 <10   Sperm NONE SEEN   CBC     Status: Abnormal   Collection Time: 12/25/22 11:34 AM   Result Value Ref Range   WBC 6.8 4.0 - 10.5 K/uL   RBC 4.10 3.87 - 5.11 MIL/uL   Hemoglobin 11.9 (L)  12.0 - 15.0 g/dL   HCT 16.1 09.6 - 04.5 %   MCV 89.0 80.0 - 100.0 fL   MCH 29.0 26.0 - 34.0 pg   MCHC 32.6 30.0 - 36.0 g/dL   RDW 40.9 81.1 - 91.4 %   Platelets 225 150 - 400 K/uL   nRBC 0.0 0.0 - 0.2 %  hCG, quantitative, pregnancy     Status: Abnormal   Collection Time: 12/25/22 11:34 AM  Result Value Ref Range   hCG, Beta Chain, Quant, S 73,456 (H) <5 mIU/mL  HIV Antibody (routine testing w rflx)     Status: None   Collection Time: 12/25/22 11:34 AM  Result Value Ref Range   HIV Screen 4th Generation wRfx Non Reactive Non Reactive    US OB LESS THAN 14 WEEKS WITH OB TRANSVAGINAL  Result Date: 12/25/2022 CLINICAL DATA:  Cramping.  Pregnant EXAM: OBSTETRIC <14 WK [place] AND TRANSVAGINAL OB US TECHNIQUE: Both transabdominal and transvaginal ultrasound examinations were performed for complete evaluation of the gestation as well as the maternal uterus, adnexal regions, and pelvic cul-de-sac. Transvaginal technique was performed to assess early pregnancy. COMPARISON:  12/19/2021 ultrasound FINDINGS: Intrauterine gestational sac: Single Yolk sac:  Visualized. Embryo:  Visualized. Cardiac Activity: Visualized. Heart Rate: 112 bpm CRL:  3.4 mm   6 w   0 d                  [place] EDC: 08/17/2023 Subchorionic hemorrhage:  None visualized. Maternal uterus/adnexae: Left ovary measures 3.7 x 3.0 x 3.1 cm and right 2.7 x 1.8 x 1.9 cm. Blood flow on Doppler. Small follicles. Trace free fluid in the dependent pelvis. IMPRESSION: Single live intrauterine pregnancy of 6 weeks and 0 days. Positive fetal heart motion. Trace free fluid in the pelvis Electronically Signed   By: Karen Kays M.D.   On: 12/25/2022 13:14     Assessment and Plan  1. Intrauterine pregnancy - Patient has OB provider list from previous pregnancy  2. Cramping complicating pregnancy, antepartum - Information provided on abdominal pain in  pregnancy   3. Morning sickness - Information provided on morning sickness  - Rx: Phenergan 25 mg po every 6 hours prn N/V - Rx: Zofran 4 mg ODT every 8 hours prn N/V - Rx: Protonix 40 mg daily  4. [redacted] weeks gestation of pregnancy   - Discharge patient - Patient verbalized an understanding of the plan of care and agrees.    Raelyn Mora, CNM 12/25/2022, 11:36 AM

## 2022-12-25 NOTE — MAU Note (Signed)
.  Deanna Garcia is a 22 y.o. at Unknown here in MAU reporting: she had +HPT at home yesterday and also started having intermittent, lower abdominal cramping (2/10). Reports N/V for the past four days in the mornings and evenings, she has also been having a lot of reflux. Denies nausea currently.   She reports concern as she had a ruptured ectopic preg last year in 11/2021 and wants to check on this pregnancy. Has not started OB care yet.   LMP: 11/12/2022 exact Onset of complaint: Yesterday Pain score: 2/10 Vitals:   12/25/22 1009  BP: 123/72  Pulse: 75  Resp: 18  Temp: 99 F (37.2 C)  SpO2: 99%     FHT: not indicated Lab orders placed from triage:  POCT preg, UA

## 2022-12-26 DIAGNOSIS — Z419 Encounter for procedure for purposes other than remedying health state, unspecified: Secondary | ICD-10-CM | POA: Diagnosis not present

## 2023-01-26 DIAGNOSIS — Z419 Encounter for procedure for purposes other than remedying health state, unspecified: Secondary | ICD-10-CM | POA: Diagnosis not present

## 2023-02-25 DIAGNOSIS — O3680X9 Pregnancy with inconclusive fetal viability, other fetus: Secondary | ICD-10-CM | POA: Diagnosis not present

## 2023-02-25 DIAGNOSIS — Z362 Encounter for other antenatal screening follow-up: Secondary | ICD-10-CM | POA: Diagnosis not present

## 2023-02-25 DIAGNOSIS — Z363 Encounter for antenatal screening for malformations: Secondary | ICD-10-CM | POA: Diagnosis not present

## 2023-02-25 DIAGNOSIS — Z419 Encounter for procedure for purposes other than remedying health state, unspecified: Secondary | ICD-10-CM | POA: Diagnosis not present

## 2023-02-25 DIAGNOSIS — Z3482 Encounter for supervision of other normal pregnancy, second trimester: Secondary | ICD-10-CM | POA: Diagnosis not present

## 2023-02-25 DIAGNOSIS — Z3A15 15 weeks gestation of pregnancy: Secondary | ICD-10-CM | POA: Diagnosis not present

## 2023-02-25 DIAGNOSIS — Z331 Pregnant state, incidental: Secondary | ICD-10-CM | POA: Diagnosis not present

## 2023-02-25 DIAGNOSIS — Z113 Encounter for screening for infections with a predominantly sexual mode of transmission: Secondary | ICD-10-CM | POA: Diagnosis not present

## 2023-02-25 LAB — OB RESULTS CONSOLE ANTIBODY SCREEN: Antibody Screen: NEGATIVE

## 2023-02-25 LAB — OB RESULTS CONSOLE HEPATITIS B SURFACE ANTIGEN: Hepatitis B Surface Ag: NEGATIVE

## 2023-02-25 LAB — OB RESULTS CONSOLE RUBELLA ANTIBODY, IGM: Rubella: IMMUNE

## 2023-02-25 LAB — HEPATITIS C ANTIBODY: HCV Ab: NEGATIVE

## 2023-02-25 LAB — OB RESULTS CONSOLE RPR: RPR: NONREACTIVE

## 2023-02-25 LAB — OB RESULTS CONSOLE HIV ANTIBODY (ROUTINE TESTING): HIV: NONREACTIVE

## 2023-02-27 ENCOUNTER — Other Ambulatory Visit: Payer: Self-pay | Admitting: Obstetrics and Gynecology

## 2023-02-27 DIAGNOSIS — Z363 Encounter for antenatal screening for malformations: Secondary | ICD-10-CM

## 2023-03-11 ENCOUNTER — Other Ambulatory Visit: Payer: Self-pay

## 2023-03-19 ENCOUNTER — Encounter: Payer: Self-pay | Admitting: *Deleted

## 2023-03-19 DIAGNOSIS — Z9079 Acquired absence of other genital organ(s): Secondary | ICD-10-CM | POA: Insufficient documentation

## 2023-03-19 DIAGNOSIS — O34219 Maternal care for unspecified type scar from previous cesarean delivery: Secondary | ICD-10-CM | POA: Insufficient documentation

## 2023-03-28 DIAGNOSIS — Z419 Encounter for procedure for purposes other than remedying health state, unspecified: Secondary | ICD-10-CM | POA: Diagnosis not present

## 2023-04-01 ENCOUNTER — Other Ambulatory Visit: Payer: Self-pay

## 2023-04-01 ENCOUNTER — Encounter: Payer: Self-pay | Admitting: *Deleted

## 2023-04-01 ENCOUNTER — Ambulatory Visit: Payer: Medicaid Other | Attending: Obstetrics and Gynecology

## 2023-04-01 ENCOUNTER — Other Ambulatory Visit: Payer: Self-pay | Admitting: *Deleted

## 2023-04-01 ENCOUNTER — Ambulatory Visit: Payer: Medicaid Other | Admitting: *Deleted

## 2023-04-01 DIAGNOSIS — O99210 Obesity complicating pregnancy, unspecified trimester: Secondary | ICD-10-CM

## 2023-04-01 DIAGNOSIS — Z363 Encounter for antenatal screening for malformations: Secondary | ICD-10-CM | POA: Diagnosis not present

## 2023-04-16 DIAGNOSIS — O99210 Obesity complicating pregnancy, unspecified trimester: Secondary | ICD-10-CM | POA: Insufficient documentation

## 2023-04-27 DIAGNOSIS — Z419 Encounter for procedure for purposes other than remedying health state, unspecified: Secondary | ICD-10-CM | POA: Diagnosis not present

## 2023-04-29 ENCOUNTER — Other Ambulatory Visit: Payer: Self-pay

## 2023-04-29 ENCOUNTER — Ambulatory Visit: Payer: Medicaid Other | Attending: Obstetrics

## 2023-04-29 VITALS — BP 122/64

## 2023-04-29 DIAGNOSIS — D649 Anemia, unspecified: Secondary | ICD-10-CM

## 2023-04-29 DIAGNOSIS — O99512 Diseases of the respiratory system complicating pregnancy, second trimester: Secondary | ICD-10-CM

## 2023-04-29 DIAGNOSIS — J45909 Unspecified asthma, uncomplicated: Secondary | ICD-10-CM

## 2023-04-29 DIAGNOSIS — O34219 Maternal care for unspecified type scar from previous cesarean delivery: Secondary | ICD-10-CM

## 2023-04-29 DIAGNOSIS — E669 Obesity, unspecified: Secondary | ICD-10-CM

## 2023-04-29 DIAGNOSIS — O99210 Obesity complicating pregnancy, unspecified trimester: Secondary | ICD-10-CM | POA: Diagnosis not present

## 2023-04-29 DIAGNOSIS — Z3A24 24 weeks gestation of pregnancy: Secondary | ICD-10-CM

## 2023-04-29 DIAGNOSIS — O99212 Obesity complicating pregnancy, second trimester: Secondary | ICD-10-CM | POA: Diagnosis not present

## 2023-04-29 DIAGNOSIS — O99012 Anemia complicating pregnancy, second trimester: Secondary | ICD-10-CM | POA: Diagnosis not present

## 2023-04-29 NOTE — Progress Notes (Unsigned)
122/64

## 2023-05-13 DIAGNOSIS — Z880 Allergy status to penicillin: Secondary | ICD-10-CM | POA: Diagnosis not present

## 2023-05-13 DIAGNOSIS — D649 Anemia, unspecified: Secondary | ICD-10-CM | POA: Diagnosis not present

## 2023-05-13 DIAGNOSIS — Z3482 Encounter for supervision of other normal pregnancy, second trimester: Secondary | ICD-10-CM | POA: Diagnosis not present

## 2023-05-13 DIAGNOSIS — Z23 Encounter for immunization: Secondary | ICD-10-CM | POA: Diagnosis not present

## 2023-05-13 DIAGNOSIS — Z331 Pregnant state, incidental: Secondary | ICD-10-CM | POA: Diagnosis not present

## 2023-05-13 DIAGNOSIS — Z3A26 26 weeks gestation of pregnancy: Secondary | ICD-10-CM | POA: Diagnosis not present

## 2023-05-28 DIAGNOSIS — Z419 Encounter for procedure for purposes other than remedying health state, unspecified: Secondary | ICD-10-CM | POA: Diagnosis not present

## 2023-05-29 DIAGNOSIS — Z369 Encounter for antenatal screening, unspecified: Secondary | ICD-10-CM | POA: Diagnosis not present

## 2023-06-28 DIAGNOSIS — Z419 Encounter for procedure for purposes other than remedying health state, unspecified: Secondary | ICD-10-CM | POA: Diagnosis not present

## 2023-06-29 ENCOUNTER — Inpatient Hospital Stay (HOSPITAL_COMMUNITY)
Admission: AD | Admit: 2023-06-29 | Discharge: 2023-06-29 | Disposition: A | Discharge status: Home / Self Care | Payer: Medicaid Other | Attending: Obstetrics & Gynecology | Admitting: Obstetrics & Gynecology

## 2023-06-29 ENCOUNTER — Other Ambulatory Visit: Payer: Self-pay

## 2023-06-29 ENCOUNTER — Encounter (HOSPITAL_COMMUNITY): Payer: Self-pay | Admitting: Obstetrics & Gynecology

## 2023-06-29 DIAGNOSIS — O26892 Other specified pregnancy related conditions, second trimester: Secondary | ICD-10-CM | POA: Diagnosis not present

## 2023-06-29 DIAGNOSIS — N760 Acute vaginitis: Secondary | ICD-10-CM

## 2023-06-29 DIAGNOSIS — Z3A32 32 weeks gestation of pregnancy: Secondary | ICD-10-CM | POA: Insufficient documentation

## 2023-06-29 DIAGNOSIS — B9689 Other specified bacterial agents as the cause of diseases classified elsewhere: Secondary | ICD-10-CM | POA: Insufficient documentation

## 2023-06-29 DIAGNOSIS — O26893 Other specified pregnancy related conditions, third trimester: Secondary | ICD-10-CM | POA: Insufficient documentation

## 2023-06-29 DIAGNOSIS — R030 Elevated blood-pressure reading, without diagnosis of hypertension: Secondary | ICD-10-CM | POA: Insufficient documentation

## 2023-06-29 DIAGNOSIS — O99013 Anemia complicating pregnancy, third trimester: Secondary | ICD-10-CM

## 2023-06-29 LAB — COMPREHENSIVE METABOLIC PANEL
ALT: 17 U/L (ref 0–44)
AST: 28 U/L (ref 15–41)
Albumin: 2.7 g/dL — ABNORMAL LOW (ref 3.5–5.0)
Alkaline Phosphatase: 111 U/L (ref 38–126)
Anion gap: 10 (ref 5–15)
BUN: 8 mg/dL (ref 6–20)
CO2: 22 mmol/L (ref 22–32)
Calcium: 9.4 mg/dL (ref 8.9–10.3)
Chloride: 104 mmol/L (ref 98–111)
Creatinine, Ser: 0.68 mg/dL (ref 0.44–1.00)
GFR, Estimated: >60 mL/min (ref >60)
Glucose, Bld: 85 mg/dL (ref 70–99)
Potassium: 3.9 mmol/L (ref 3.5–5.1)
Sodium: 136 mmol/L (ref 135–145)
Total Bilirubin: 1.3 mg/dL — ABNORMAL HIGH (ref 0.0–1.2)
Total Protein: 6.1 g/dL — ABNORMAL LOW (ref 6.5–8.1)

## 2023-06-29 LAB — PROTEIN / CREATININE RATIO, URINE
Creatinine, Urine: 338 mg/dL
Protein Creatinine Ratio: 0.1 mg/mg{creat} (ref 0.00–0.15)
Total Protein, Urine: 33 mg/dL

## 2023-06-29 LAB — URINALYSIS, ROUTINE W REFLEX MICROSCOPIC
Glucose, UA: NEGATIVE mg/dL
Hgb urine dipstick: NEGATIVE
Ketones, ur: NEGATIVE mg/dL
Leukocytes,Ua: NEGATIVE
Nitrite: NEGATIVE
Protein, ur: 30 mg/dL — AB
Specific Gravity, Urine: 1.027 (ref 1.005–1.030)
pH: 5 (ref 5.0–8.0)

## 2023-06-29 LAB — CBC
HCT: 32 % — ABNORMAL LOW (ref 36.0–46.0)
Hemoglobin: 10.6 g/dL — ABNORMAL LOW (ref 12.0–15.0)
MCH: 29.2 pg (ref 26.0–34.0)
MCHC: 33.1 g/dL (ref 30.0–36.0)
MCV: 88.2 fL (ref 80.0–100.0)
Platelets: 220 10*3/uL (ref 150–400)
RBC: 3.63 MIL/uL — ABNORMAL LOW (ref 3.87–5.11)
RDW: 13.8 % (ref 11.5–15.5)
WBC: 4.8 10*3/uL (ref 4.0–10.5)
nRBC: 0 % (ref 0.0–0.2)

## 2023-06-29 LAB — WET PREP, GENITAL
Sperm: NONE SEEN
Trich, Wet Prep: NONE SEEN
WBC, Wet Prep HPF POC: >=10 — AB (ref <10)
Yeast Wet Prep HPF POC: NONE SEEN

## 2023-06-29 MED ORDER — NIFEDIPINE 10 MG PO CAPS: 10.0000 mg | ORAL_CAPSULE | ORAL | Status: DC | PRN

## 2023-06-29 MED ORDER — DOCUSATE SODIUM 100 MG PO CAPS: 100.0000 mg | ORAL_CAPSULE | ORAL | 0 refills | Status: DC

## 2023-06-29 MED ORDER — NIFEDIPINE 10 MG PO CAPS: 20.0000 mg | ORAL_CAPSULE | ORAL | Status: DC | PRN

## 2023-06-29 MED ORDER — FERROUS SULFATE 325 (65 FE) MG PO TABS: 325.0000 mg | ORAL_TABLET | Freq: Every day | ORAL | 0 refills | Status: AC

## 2023-06-29 MED ORDER — METRONIDAZOLE 500 MG PO TABS: 500.0000 mg | ORAL_TABLET | Freq: Two times a day (BID) | ORAL | 0 refills | Status: DC

## 2023-06-29 MED ORDER — LABETALOL HCL 5 MG/ML IV SOLN: 40.0000 mg | INTRAVENOUS | Status: DC | PRN

## 2023-06-29 NOTE — MAU Note (Signed)
.  Deanna Garcia is a 23 y.o. at [redacted]w[redacted]d here in MAU reporting: she lost her mucous plug around 1000 this morning and pelvic pain that started last night.  Denies vag bleeding or LOF +FM   Onset of complaint: 1000 Pain score: 7 Vitals:   06/29/23 1433 06/29/23 1435  BP:  132/83  Pulse: 84   Resp: 16   Temp: 98.5 F (36.9 C)   SpO2: 96%      FHT: 150 Lab orders placed from triage: ua

## 2023-06-29 NOTE — MAU Provider Note (Signed)
Chief Complaint:  Vaginal Discharge and Pelvic Pain   HPI     Deanna Garcia is a 23 y.o. G3P1011 at [redacted]w[redacted]d who presents to maternity admissions reporting loss of mucus plug vs mucoid discharge and mild pelvic cramping. She denies any VB, LOF, and reports good FM's   Pregnancy Course: CC OB/GYN  Past Medical History:  Diagnosis Date   Anxiety    Asthma    GERD (gastroesophageal reflux disease)    Migraines    OB History  Gravida Para Term Preterm AB Living  3 1 1  0 1 1  SAB IAB Ectopic Multiple Live Births  0 0 1 0 1    # Outcome Date GA Lbr Len/2nd Weight Sex Type Anes PTL Lv  3 Current           2 Ectopic 12/19/21 [redacted]w[redacted]d    ECTOPIC        Birth Comments: ruptured ectopic  1 Term 09/12/18 [redacted]w[redacted]d  3799 g M CS-LTranv  N LIV    Obstetric Comments  "Bones were too small"   Past Surgical History:  Procedure Laterality Date   CESAREAN SECTION     DIAGNOSTIC LAPAROSCOPY WITH REMOVAL OF ECTOPIC PREGNANCY N/A 12/19/2021   Procedure: DIAGNOSTIC LAPAROSCOPY WITH REMOVAL OF FALLOPIAN TUBE;  Surgeon: Avon Bing, MD;  Location: [facility] OR;  Service: Gynecology;  Laterality: N/A;   Family History  Problem Relation Age of Onset   COPD Mother    Healthy Father    Social History   Tobacco Use   Smoking status: Never    Passive exposure: Yes   Smokeless tobacco: Never  Vaping Use   Vaping status: Some Days  Substance Use Topics   Alcohol use: No   Drug use: Not Currently    Types: Marijuana   Allergies  Allergen Reactions   Penicillins Hives   Amoxicillin Rash   Medications Prior to Admission  Medication Sig Dispense Refill Last Dose/Taking   acetaminophen (TYLENOL) 325 MG tablet Take 650 mg by mouth every 6 (six) hours as needed. For pain (Patient not taking: Reported on 01/09/2022)      albuterol (PROVENTIL HFA;VENTOLIN HFA) 108 (90 BASE) MCG/ACT inhaler Inhale 2 puffs into the lungs every 6 (six) hours as needed. For shortness of breath/wheezing (Patient not taking:  Reported on 01/09/2022)      ondansetron (ZOFRAN-ODT) 4 MG disintegrating tablet Take 1 tablet (4 mg total) by mouth every 8 (eight) hours as needed for nausea or vomiting. (Patient not taking: Reported on 04/01/2023) 30 tablet 0    pantoprazole (PROTONIX) 40 MG tablet Take 1 tablet (40 mg total) by mouth daily. (Patient not taking: Reported on 04/01/2023) 30 tablet 0    Prenatal Vit-Fe Fumarate-FA (MULTIVITAMIN-PRENATAL) 27-0.8 MG TABS tablet Take 1 tablet by mouth daily at 12 noon.      promethazine (PHENERGAN) 25 MG tablet Take 1 tablet (25 mg total) by mouth every 6 (six) hours as needed for nausea or vomiting. If unable to keep medication down, insert pill vaginally 30 tablet 0     I have reviewed patient's Past Medical Hx, Surgical Hx, Family Hx, Social Hx, medications and allergies.   ROS  Pertinent items noted in HPI and remainder of comprehensive ROS otherwise negative.   PHYSICAL EXAM  Patient Vitals for the past 24 hrs:  BP Temp Temp src Pulse Resp SpO2 Height Weight  06/29/23 1646 120/65 -- -- 79 -- -- -- --  06/29/23 1631 132/71 -- -- 71 -- -- -- --  06/29/23 1616 132/74 -- -- 81 -- -- -- --  06/29/23 1601 133/72 -- -- 87 -- -- -- --  06/29/23 1546 (!) 146/80 -- -- 83 -- -- -- --  06/29/23 1531 (!) 141/72 -- -- 84 -- -- -- --  06/29/23 1516 (!) 141/78 -- -- 83 -- -- -- --  06/29/23 1501 (!) 146/81 -- -- 92 -- -- -- --  06/29/23 1456 (!) 146/76 -- -- 91 -- -- -- --  06/29/23 1435 132/83 -- -- -- -- -- -- --  06/29/23 1433 -- 98.5 F (36.9 C) Oral 84 16 96 % 5\' 8"  (1.727 m) 101.2 kg    Constitutional: Well-developed, well-nourished female in no acute distress.  Cardiovascular: normal rate & rhythm, warm and well-perfused Respiratory: normal effort, no problems with respiration noted GI: Abd soft, non-tender, non-distended MS: Extremities nontender, no edema, normal ROM Neurologic: Alert and oriented x 4.  GU: no CVA tenderness Pelvic: NEFG, physiologic discharge, no blood,  cervix clean. Cx obtained  Dilation: 1 Effacement (%): Thick Cervical Position: Middle Station: Ballotable Exam by:: Keyerra Lamere, NP  Fetal Tracing: Baseline: 135-140 Variability: Moderate Accelerations: present Decelerations:absent  Toco: irregular    Labs: Results for orders placed or performed during the hospital encounter of 06/29/23 (from the past 24 hours)  Urinalysis, Routine w reflex microscopic -Urine, Clean Catch     Status: Abnormal   Collection Time: 06/29/23  2:47 PM  Result Value Ref Range   Color, Urine AMBER (A) YELLOW   APPearance HAZY (A) CLEAR   Specific Gravity, Urine 1.027 1.005 - 1.030   pH 5.0 5.0 - 8.0   Glucose, UA NEGATIVE NEGATIVE mg/dL   Hgb urine dipstick NEGATIVE NEGATIVE   Bilirubin Urine SMALL (A) NEGATIVE   Ketones, ur NEGATIVE NEGATIVE mg/dL   Protein, ur 30 (A) NEGATIVE mg/dL   Nitrite NEGATIVE NEGATIVE   Leukocytes,Ua NEGATIVE NEGATIVE   RBC / HPF 6-10 0 - 5 RBC/hpf   WBC, UA 6-10 0 - 5 WBC/hpf   Bacteria, UA FEW (A) NONE SEEN   Squamous Epithelial / HPF 11-20 0 - 5 /HPF   Mucus PRESENT   Protein / creatinine ratio, urine     Status: None   Collection Time: 06/29/23  2:47 PM  Result Value Ref Range   Creatinine, Urine 338 mg/dL   Total Protein, Urine 33 mg/dL   Protein Creatinine Ratio 0.10 0.00 - 0.15 mg/mg[Cre]  CBC     Status: Abnormal   Collection Time: 06/29/23  4:03 PM  Result Value Ref Range   WBC 4.8 4.0 - 10.5 K/uL   RBC 3.63 (L) 3.87 - 5.11 MIL/uL   Hemoglobin 10.6 (L) 12.0 - 15.0 g/dL   HCT 86.5 (L) 78.4 - 69.6 %   MCV 88.2 80.0 - 100.0 fL   MCH 29.2 26.0 - 34.0 pg   MCHC 33.1 30.0 - 36.0 g/dL   RDW 29.5 28.4 - 13.2 %   Platelets 220 150 - 400 K/uL   nRBC 0.0 0.0 - 0.2 %  Comprehensive metabolic panel     Status: Abnormal   Collection Time: 06/29/23  4:03 PM  Result Value Ref Range   Sodium 136 135 - 145 mmol/L   Potassium 3.9 3.5 - 5.1 mmol/L   Chloride 104 98 - 111 mmol/L   CO2 22 22 - 32 mmol/L   Glucose, Bld  85 70 - 99 mg/dL   BUN 8 6 - 20 mg/dL   Creatinine, Ser 4.40 0.44 -  1.00 mg/dL   Calcium 9.4 8.9 - 84.6 mg/dL   Total Protein 6.1 (L) 6.5 - 8.1 g/dL   Albumin 2.7 (L) 3.5 - 5.0 g/dL   AST 28 15 - 41 U/L   ALT 17 0 - 44 U/L   Alkaline Phosphatase 111 38 - 126 U/L   Total Bilirubin 1.3 (H) 0.0 - 1.2 mg/dL   GFR, Estimated >96 >29 mL/min   Anion gap 10 5 - 15  Wet prep, genital     Status: Abnormal   Collection Time: 06/29/23  4:03 PM  Result Value Ref Range   Yeast Wet Prep HPF POC NONE SEEN NONE SEEN   Trich, Wet Prep NONE SEEN NONE SEEN   Clue Cells Wet Prep HPF POC PRESENT (A) NONE SEEN   WBC, Wet Prep HPF POC >=10 (A) <10   Sperm NONE SEEN     Imaging:  No results found.  MDM & MAU COURSE  MDM:  HIGH  BV Anemic Elevated BP's w/o DX of Hypertension    I have reviewed the patient chart and performed the physical exam.  Medications ordered as stated below.  A/P as described below.  Counseling and education provided and patient agreeable  with plan as described below. Verbalized understanding.     MAU Course: Orders Placed This Encounter  Procedures   Wet prep, genital   Urinalysis, Routine w reflex microscopic -Urine, Clean Catch   CBC   Comprehensive metabolic panel   Protein / creatinine ratio, urine   Notify physician (specify) Confirmatory reading of BP> 160/110 15 minutes later   Apply Hypertensive Disorders of Pregnancy Care Plan   Vital signs   Measure blood pressure   Fern Test   Discharge patient   Meds ordered this encounter  Medications   AND Linked Order Group    NIFEdipine (PROCARDIA) capsule 10 mg    NIFEdipine (PROCARDIA) capsule 20 mg    NIFEdipine (PROCARDIA) capsule 20 mg    labetalol (NORMODYNE) injection 40 mg   ferrous sulfate 325 (65 FE) MG tablet    Sig: Take 1 tablet (325 mg total) by mouth daily.    Dispense:  30 tablet    Refill:  0    Supervising Provider:   Reva Bores [[id number]]   docusate sodium (COLACE) 100 MG capsule     Sig: Take 1 capsule (100 mg total) by mouth every other day.    Dispense:  10 capsule    Refill:  0    Supervising Provider:   Reva Bores [[id number]]   metroNIDAZOLE (FLAGYL) 500 MG tablet    Sig: Take 1 tablet (500 mg total) by mouth 2 (two) times daily.    Dispense:  14 tablet    Refill:  0    Supervising Provider:   Reva Bores [[id number]]    ASSESSMENT   1. Bacterial vaginosis   2. [redacted] weeks gestation of pregnancy   3. Elevated blood pressure reading without diagnosis of hypertension   4. Anemia affecting pregnancy in third trimester     PLAN  Discharge home in stable condition with return precautions.  OB f/u as scheduled      Allergies as of 06/29/2023       Reactions   Penicillins Hives   Amoxicillin Rash        Medication List     TAKE these medications    acetaminophen 325 MG tablet Commonly known as: TYLENOL Take 650 mg by mouth every 6 (six)  hours as needed. For pain   albuterol 108 (90 Base) MCG/ACT inhaler Commonly known as: VENTOLIN HFA Inhale 2 puffs into the lungs every 6 (six) hours as needed. For shortness of breath/wheezing   docusate sodium 100 MG capsule Commonly known as: Colace Take 1 capsule (100 mg total) by mouth every other day.   ferrous sulfate 325 (65 FE) MG tablet Take 1 tablet (325 mg total) by mouth daily.   metroNIDAZOLE 500 MG tablet Commonly known as: FLAGYL Take 1 tablet (500 mg total) by mouth 2 (two) times daily.   multivitamin-prenatal 27-0.8 MG Tabs tablet Take 1 tablet by mouth daily at 12 noon.   ondansetron 4 MG disintegrating tablet Commonly known as: ZOFRAN-ODT Take 1 tablet (4 mg total) by mouth every 8 (eight) hours as needed for nausea or vomiting.   pantoprazole 40 MG tablet Commonly known as: Protonix Take 1 tablet (40 mg total) by mouth daily.   promethazine 25 MG tablet Commonly known as: PHENERGAN Take 1 tablet (25 mg total) by mouth every 6 (six) hours as needed for nausea or vomiting. If unable to  keep medication down, insert pill vaginally        Marcell Barlow, MSN, [facility], Center for [employer]

## 2023-06-30 LAB — GC/CHLAMYDIA PROBE AMP (~~LOC~~) NOT AT ARMC
Chlamydia: NEGATIVE
Comment: NEGATIVE
Comment: NORMAL
Neisseria Gonorrhea: NEGATIVE

## 2023-07-11 DIAGNOSIS — O4290 Premature rupture of membranes, unspecified as to length of time between rupture and onset of labor, unspecified weeks of gestation: Secondary | ICD-10-CM | POA: Diagnosis not present

## 2023-07-25 DIAGNOSIS — Z880 Allergy status to penicillin: Secondary | ICD-10-CM | POA: Diagnosis not present

## 2023-07-25 DIAGNOSIS — Z3A36 36 weeks gestation of pregnancy: Secondary | ICD-10-CM | POA: Diagnosis not present

## 2023-07-25 DIAGNOSIS — Z364 Encounter for antenatal screening for fetal growth retardation: Secondary | ICD-10-CM | POA: Diagnosis not present

## 2023-07-25 DIAGNOSIS — Z331 Pregnant state, incidental: Secondary | ICD-10-CM | POA: Diagnosis not present

## 2023-07-25 DIAGNOSIS — D649 Anemia, unspecified: Secondary | ICD-10-CM | POA: Diagnosis not present

## 2023-07-25 DIAGNOSIS — Z9889 Other specified postprocedural states: Secondary | ICD-10-CM | POA: Diagnosis not present

## 2023-07-25 DIAGNOSIS — Z8759 Personal history of other complications of pregnancy, childbirth and the puerperium: Secondary | ICD-10-CM | POA: Diagnosis not present

## 2023-07-25 DIAGNOSIS — Z369 Encounter for antenatal screening, unspecified: Secondary | ICD-10-CM | POA: Diagnosis not present

## 2023-07-25 DIAGNOSIS — Z113 Encounter for screening for infections with a predominantly sexual mode of transmission: Secondary | ICD-10-CM | POA: Diagnosis not present

## 2023-07-25 LAB — OB RESULTS CONSOLE GBS: GBS: NEGATIVE

## 2023-07-26 DIAGNOSIS — Z419 Encounter for procedure for purposes other than remedying health state, unspecified: Secondary | ICD-10-CM | POA: Diagnosis not present

## 2023-08-07 ENCOUNTER — Other Ambulatory Visit: Payer: Self-pay | Admitting: Obstetrics and Gynecology

## 2023-08-08 ENCOUNTER — Encounter (HOSPITAL_COMMUNITY): Payer: Self-pay

## 2023-08-08 NOTE — Patient Instructions (Signed)
 Deanna Garcia  08/08/2023   Your procedure is scheduled on:  08/13/2023  Arrive at 1215 at Entrance C on [address] at [facility]  and [employer]. You are invited to use the FREE valet parking or use the Visitor's parking deck.  Pick up the phone at the desk and dial [phone].  Call this number if you have problems the morning of surgery: [phone]  Remember:   Do not eat food:(After Midnight) Desps de medianoche.  You may drink clear liquids until arrival at __1215___.  Clear liquids means a liquid you can see thru.  It can have color such as Cola or Kool aid.  Tea is OK and coffee as long as no milk or creamer of any kind.  Take these medicines the morning of surgery with A SIP OF WATER:  none   Do not wear jewelry, make-up or nail polish.  Do not wear lotions, powders, or perfumes. Do not wear deodorant.  Do not shave 48 hours prior to surgery.  Do not bring valuables to the hospital.  [facility] is not   responsible for any belongings or valuables brought to the hospital.  Contacts, dentures or bridgework may not be worn into surgery.  Leave suitcase in the car. After surgery it may be brought to your room.  For patients admitted to the hospital, checkout time is 11:00 AM the day of              discharge.      Please read over the following fact sheets that you were given:     Preparing for Surgery

## 2023-08-10 MED ORDER — DEXMEDETOMIDINE HCL IN NACL 80 MCG/20ML IV SOLN
INTRAVENOUS | Status: AC
  Filled 2023-08-10: qty 20

## 2023-08-12 ENCOUNTER — Encounter (HOSPITAL_COMMUNITY)
Admission: RE | Admit: 2023-08-12 | Discharge: 2023-08-12 | Disposition: A | Discharge status: Home / Self Care | Source: Ambulatory Visit | Attending: Obstetrics and Gynecology | Admitting: Obstetrics and Gynecology

## 2023-08-12 DIAGNOSIS — Z01812 Encounter for preprocedural laboratory examination: Secondary | ICD-10-CM | POA: Insufficient documentation

## 2023-08-12 LAB — CBC
HCT: 32.6 % — ABNORMAL LOW (ref 36.0–46.0)
Hemoglobin: 10.9 g/dL — ABNORMAL LOW (ref 12.0–15.0)
MCH: 30.1 pg (ref 26.0–34.0)
MCHC: 33.4 g/dL (ref 30.0–36.0)
MCV: 90.1 fL (ref 80.0–100.0)
Platelets: 210 10*3/uL (ref 150–400)
RBC: 3.62 MIL/uL — ABNORMAL LOW (ref 3.87–5.11)
RDW: 14.4 % (ref 11.5–15.5)
WBC: 7.8 10*3/uL (ref 4.0–10.5)
nRBC: 0 % (ref 0.0–0.2)

## 2023-08-12 LAB — TYPE AND SCREEN
ABO/RH(D): O POS
Antibody Screen: NEGATIVE

## 2023-08-12 LAB — RPR: RPR Ser Ql: NONREACTIVE

## 2023-08-13 ENCOUNTER — Other Ambulatory Visit: Payer: Self-pay

## 2023-08-13 ENCOUNTER — Encounter (HOSPITAL_COMMUNITY): Payer: Self-pay | Admitting: Obstetrics and Gynecology

## 2023-08-13 ENCOUNTER — Inpatient Hospital Stay (HOSPITAL_COMMUNITY)
Admission: RE | Admit: 2023-08-13 | Discharge: 2023-08-15 | DRG: 787 | Disposition: A | Discharge status: Home / Self Care | Attending: Obstetrics and Gynecology | Admitting: Obstetrics and Gynecology

## 2023-08-13 ENCOUNTER — Inpatient Hospital Stay (HOSPITAL_COMMUNITY): Payer: Self-pay | Admitting: Anesthesiology

## 2023-08-13 ENCOUNTER — Encounter (HOSPITAL_COMMUNITY)
Admission: RE | Disposition: A | Discharge status: Home / Self Care | Payer: Self-pay | Source: Home / Self Care | Attending: Obstetrics and Gynecology

## 2023-08-13 DIAGNOSIS — O9902 Anemia complicating childbirth: Secondary | ICD-10-CM | POA: Diagnosis not present

## 2023-08-13 DIAGNOSIS — O9962 Diseases of the digestive system complicating childbirth: Secondary | ICD-10-CM | POA: Diagnosis not present

## 2023-08-13 DIAGNOSIS — O139 Gestational [pregnancy-induced] hypertension without significant proteinuria, unspecified trimester: Secondary | ICD-10-CM | POA: Diagnosis not present

## 2023-08-13 DIAGNOSIS — O34219 Maternal care for unspecified type scar from previous cesarean delivery: Secondary | ICD-10-CM

## 2023-08-13 DIAGNOSIS — K219 Gastro-esophageal reflux disease without esophagitis: Secondary | ICD-10-CM | POA: Diagnosis present

## 2023-08-13 DIAGNOSIS — Z3A39 39 weeks gestation of pregnancy: Secondary | ICD-10-CM

## 2023-08-13 DIAGNOSIS — O34211 Maternal care for low transverse scar from previous cesarean delivery: Secondary | ICD-10-CM

## 2023-08-13 DIAGNOSIS — Z98891 History of uterine scar from previous surgery: Secondary | ICD-10-CM

## 2023-08-13 DIAGNOSIS — Z9889 Other specified postprocedural states: Principal | ICD-10-CM

## 2023-08-13 DIAGNOSIS — D62 Acute posthemorrhagic anemia: Secondary | ICD-10-CM | POA: Diagnosis not present

## 2023-08-13 DIAGNOSIS — Z8709 Personal history of other diseases of the respiratory system: Secondary | ICD-10-CM

## 2023-08-13 DIAGNOSIS — Z8659 Personal history of other mental and behavioral disorders: Secondary | ICD-10-CM

## 2023-08-13 DIAGNOSIS — O134 Gestational [pregnancy-induced] hypertension without significant proteinuria, complicating childbirth: Secondary | ICD-10-CM | POA: Diagnosis present

## 2023-08-13 DIAGNOSIS — Z3A00 Weeks of gestation of pregnancy not specified: Secondary | ICD-10-CM | POA: Diagnosis not present

## 2023-08-13 LAB — PROTEIN / CREATININE RATIO, URINE
Creatinine, Urine: 47 mg/dL
Protein Creatinine Ratio: 2.26 mg/mg{creat} — ABNORMAL HIGH (ref 0.00–0.15)
Total Protein, Urine: 106 mg/dL

## 2023-08-13 SURGERY — Surgical Case
Anesthesia: Spinal

## 2023-08-13 MED ORDER — CLINDAMYCIN PHOSPHATE 900 MG/50ML IV SOLN
INTRAVENOUS | Status: AC
  Filled 2023-08-13: qty 50

## 2023-08-13 MED ORDER — SCOPOLAMINE 1 MG/3DAYS TD PT72: 1.0000 | MEDICATED_PATCH | Freq: Once | TRANSDERMAL | Status: DC

## 2023-08-13 MED ORDER — SENNOSIDES-DOCUSATE SODIUM 8.6-50 MG PO TABS
2.0000 | ORAL_TABLET | Freq: Every day | ORAL | Status: DC
  Administered 2023-08-14 – 2023-08-15 (×2): 2 via ORAL
  Filled 2023-08-13 (×2): qty 2

## 2023-08-13 MED ORDER — SODIUM CHLORIDE 0.9 % IR SOLN
Status: DC | PRN
  Administered 2023-08-13 (×3): 1

## 2023-08-13 MED ORDER — MORPHINE SULFATE (PF) 0.5 MG/ML IJ SOLN
INTRAMUSCULAR | Status: DC | PRN
  Administered 2023-08-13: 150 ug via INTRATHECAL

## 2023-08-13 MED ORDER — ONDANSETRON HCL 4 MG/2ML IJ SOLN
INTRAMUSCULAR | Status: DC | PRN
  Administered 2023-08-13: 4 mg via INTRAVENOUS

## 2023-08-13 MED ORDER — KETOROLAC TROMETHAMINE 30 MG/ML IJ SOLN
INTRAMUSCULAR | Status: AC
  Filled 2023-08-13: qty 1

## 2023-08-13 MED ORDER — NALOXONE HCL 4 MG/10ML IJ SOLN: 1.0000 ug/kg/h | INTRAVENOUS | Status: DC | PRN

## 2023-08-13 MED ORDER — SIMETHICONE 80 MG PO CHEW: 80.0000 mg | CHEWABLE_TABLET | ORAL | Status: DC | PRN

## 2023-08-13 MED ORDER — OXYTOCIN-SODIUM CHLORIDE 30-0.9 UT/500ML-% IV SOLN
INTRAVENOUS | Status: DC | PRN
  Administered 2023-08-13: 300 mL via INTRAVENOUS

## 2023-08-13 MED ORDER — OXYCODONE HCL 5 MG PO TABS: 5.0000 mg | ORAL_TABLET | ORAL | Status: DC | PRN

## 2023-08-13 MED ORDER — DEXTROSE 5 % IV SOLN: 1.5000 mg/kg | Freq: Once | INTRAVENOUS | Status: DC

## 2023-08-13 MED ORDER — SIMETHICONE 80 MG PO CHEW
80.0000 mg | CHEWABLE_TABLET | Freq: Three times a day (TID) | ORAL | Status: DC
  Administered 2023-08-14 – 2023-08-15 (×5): 80 mg via ORAL
  Filled 2023-08-13 (×5): qty 1

## 2023-08-13 MED ORDER — NALOXONE HCL 0.4 MG/ML IJ SOLN: 0.4000 mg | INTRAMUSCULAR | Status: DC | PRN

## 2023-08-13 MED ORDER — ZOLPIDEM TARTRATE 5 MG PO TABS: 5.0000 mg | ORAL_TABLET | Freq: Every evening | ORAL | Status: DC | PRN

## 2023-08-13 MED ORDER — MORPHINE SULFATE (PF) 0.5 MG/ML IJ SOLN
INTRAMUSCULAR | Status: AC
  Filled 2023-08-13: qty 10

## 2023-08-13 MED ORDER — SODIUM CHLORIDE 0.9% FLUSH: 3.0000 mL | INTRAVENOUS | Status: DC | PRN

## 2023-08-13 MED ORDER — CEFAZOLIN SODIUM-DEXTROSE 2-4 GM/100ML-% IV SOLN: 2.0000 g | Freq: Once | INTRAVENOUS | Status: DC

## 2023-08-13 MED ORDER — CLINDAMYCIN PHOSPHATE 900 MG/50ML IV SOLN
900.0000 mg | Freq: Once | INTRAVENOUS | Status: AC
  Administered 2023-08-13: 900 mg via INTRAVENOUS

## 2023-08-13 MED ORDER — ACETAMINOPHEN 500 MG PO TABS
1000.0000 mg | ORAL_TABLET | Freq: Four times a day (QID) | ORAL | Status: DC
  Administered 2023-08-13 – 2023-08-15 (×7): 1000 mg via ORAL
  Filled 2023-08-13 (×7): qty 2

## 2023-08-13 MED ORDER — COCONUT OIL OIL: 1.0000 | TOPICAL_OIL | Status: DC | PRN

## 2023-08-13 MED ORDER — WITCH HAZEL-GLYCERIN EX PADS: 1.0000 | MEDICATED_PAD | CUTANEOUS | Status: DC | PRN

## 2023-08-13 MED ORDER — DEXTROSE 5 % IV SOLN: 5.0000 mg/kg | Freq: Once | INTRAVENOUS | Status: DC

## 2023-08-13 MED ORDER — ACETAMINOPHEN 10 MG/ML IV SOLN
INTRAVENOUS | Status: DC | PRN
  Administered 2023-08-13: 1000 mg via INTRAVENOUS

## 2023-08-13 MED ORDER — LACTATED RINGERS IV SOLN: INTRAVENOUS | Status: DC

## 2023-08-13 MED ORDER — MENTHOL 3 MG MT LOZG: 1.0000 | LOZENGE | OROMUCOSAL | Status: DC | PRN

## 2023-08-13 MED ORDER — KETOROLAC TROMETHAMINE 30 MG/ML IJ SOLN
30.0000 mg | Freq: Four times a day (QID) | INTRAMUSCULAR | Status: DC | PRN
  Administered 2023-08-13: 30 mg via INTRAVENOUS

## 2023-08-13 MED ORDER — KETOROLAC TROMETHAMINE 30 MG/ML IJ SOLN: 30.0000 mg | Freq: Four times a day (QID) | INTRAMUSCULAR | Status: DC | PRN

## 2023-08-13 MED ORDER — KETOROLAC TROMETHAMINE 30 MG/ML IJ SOLN
30.0000 mg | Freq: Four times a day (QID) | INTRAMUSCULAR | Status: AC
  Administered 2023-08-13 – 2023-08-14 (×3): 30 mg via INTRAVENOUS
  Filled 2023-08-13 (×3): qty 1

## 2023-08-13 MED ORDER — DIPHENHYDRAMINE HCL 25 MG PO CAPS: 25.0000 mg | ORAL_CAPSULE | ORAL | Status: DC | PRN

## 2023-08-13 MED ORDER — DIPHENHYDRAMINE HCL 50 MG/ML IJ SOLN: 12.5000 mg | INTRAMUSCULAR | Status: DC | PRN

## 2023-08-13 MED ORDER — DEXAMETHASONE SODIUM PHOSPHATE 10 MG/ML IJ SOLN
INTRAMUSCULAR | Status: AC
  Filled 2023-08-13: qty 1

## 2023-08-13 MED ORDER — ONDANSETRON HCL 4 MG/2ML IJ SOLN: 4.0000 mg | Freq: Three times a day (TID) | INTRAMUSCULAR | Status: DC | PRN

## 2023-08-13 MED ORDER — DEXAMETHASONE SODIUM PHOSPHATE 10 MG/ML IJ SOLN
INTRAMUSCULAR | Status: DC | PRN
  Administered 2023-08-13: 10 mg via INTRAVENOUS

## 2023-08-13 MED ORDER — PRENATAL MULTIVITAMIN CH
1.0000 | ORAL_TABLET | Freq: Every day | ORAL | Status: DC
  Administered 2023-08-14 – 2023-08-15 (×2): 1 via ORAL
  Filled 2023-08-13 (×2): qty 1

## 2023-08-13 MED ORDER — ONDANSETRON HCL 4 MG/2ML IJ SOLN
INTRAMUSCULAR | Status: AC
Start: 2023-08-13 — End: ?
  Filled 2023-08-13: qty 2

## 2023-08-13 MED ORDER — DIPHENHYDRAMINE HCL 25 MG PO CAPS: 25.0000 mg | ORAL_CAPSULE | Freq: Four times a day (QID) | ORAL | Status: DC | PRN

## 2023-08-13 MED ORDER — GENTAMICIN SULFATE 40 MG/ML IJ SOLN
5.0000 mg/kg | Freq: Once | INTRAVENOUS | Status: AC
  Administered 2023-08-13: 420.4 mg via INTRAVENOUS
  Filled 2023-08-13: qty 10.5

## 2023-08-13 MED ORDER — OXYTOCIN-SODIUM CHLORIDE 30-0.9 UT/500ML-% IV SOLN: 2.5000 [IU]/h | INTRAVENOUS | Status: AC

## 2023-08-13 MED ORDER — SOD CITRATE-CITRIC ACID 500-334 MG/5ML PO SOLN
30.0000 mL | ORAL | Status: AC
  Administered 2023-08-13: 30 mL via ORAL

## 2023-08-13 MED ORDER — IBUPROFEN 600 MG PO TABS
600.0000 mg | ORAL_TABLET | Freq: Four times a day (QID) | ORAL | Status: DC
  Administered 2023-08-14 – 2023-08-15 (×3): 600 mg via ORAL
  Filled 2023-08-13 (×3): qty 1

## 2023-08-13 MED ORDER — FENTANYL CITRATE (PF) 100 MCG/2ML IJ SOLN
INTRAMUSCULAR | Status: DC | PRN
  Administered 2023-08-13: 15 ug via INTRATHECAL

## 2023-08-13 MED ORDER — PHENYLEPHRINE HCL-NACL 20-0.9 MG/250ML-% IV SOLN
INTRAVENOUS | Status: DC | PRN
  Administered 2023-08-13: 60 ug/min via INTRAVENOUS

## 2023-08-13 MED ORDER — SODIUM CHLORIDE 0.9 % IV SOLN: 500.0000 mg | INTRAVENOUS | Status: DC

## 2023-08-13 MED ORDER — BUPIVACAINE IN DEXTROSE 0.75-8.25 % IT SOLN
INTRATHECAL | Status: DC | PRN
  Administered 2023-08-13: 1.6 mL via INTRATHECAL

## 2023-08-13 MED ORDER — STERILE WATER FOR IRRIGATION IR SOLN
Status: DC | PRN
  Administered 2023-08-13: 1000 mL

## 2023-08-13 MED ORDER — DIBUCAINE (PERIANAL) 1 % EX OINT: 1.0000 | TOPICAL_OINTMENT | CUTANEOUS | Status: DC | PRN

## 2023-08-13 MED ORDER — FENTANYL CITRATE (PF) 100 MCG/2ML IJ SOLN: 25.0000 ug | INTRAMUSCULAR | Status: DC | PRN

## 2023-08-13 MED ORDER — SOD CITRATE-CITRIC ACID 500-334 MG/5ML PO SOLN
ORAL | Status: AC
  Filled 2023-08-13: qty 30

## 2023-08-13 MED ORDER — FENTANYL CITRATE (PF) 100 MCG/2ML IJ SOLN
INTRAMUSCULAR | Status: AC
  Filled 2023-08-13: qty 2

## 2023-08-13 MED ORDER — MEPERIDINE HCL 25 MG/ML IJ SOLN: 6.2500 mg | INTRAMUSCULAR | Status: DC | PRN

## 2023-08-13 MED ORDER — ACETAMINOPHEN 500 MG PO TABS: 1000.0000 mg | ORAL_TABLET | Freq: Four times a day (QID) | ORAL | Status: DC

## 2023-08-13 SURGICAL SUPPLY — 35 items
BENZOIN TINCTURE PRP APPL 2/3 (GAUZE/BANDAGES/DRESSINGS) ×1 IMPLANT
CHLORAPREP W/TINT 26 (MISCELLANEOUS) ×2 IMPLANT
CLAMP UMBILICAL CORD (MISCELLANEOUS) ×1 IMPLANT
CLOTH BEACON ORANGE TIMEOUT ST (SAFETY) ×1 IMPLANT
DRSG OPSITE POSTOP 4X10 (GAUZE/BANDAGES/DRESSINGS) ×1 IMPLANT
ELECT REM PT RETURN 9FT ADLT (ELECTROSURGICAL) ×1 IMPLANT
ELECTRODE REM PT RTRN 9FT ADLT (ELECTROSURGICAL) ×1 IMPLANT
EXTRACTOR VACUUM M CUP 4 TUBE (SUCTIONS) IMPLANT
GAUZE PAD ABD 7.5X8 STRL (GAUZE/BANDAGES/DRESSINGS) IMPLANT
GAUZE SPONGE 4X4 12PLY STRL LF (GAUZE/BANDAGES/DRESSINGS) IMPLANT
GLOVE BIO SURGEON STRL SZ7.5 (GLOVE) ×1 IMPLANT
GLOVE BIOGEL PI IND STRL 7.0 (GLOVE) ×1 IMPLANT
GLOVE BIOGEL PI IND STRL 7.5 (GLOVE) ×1 IMPLANT
GOWN STRL REUS W/TWL LRG LVL3 (GOWN DISPOSABLE) ×2 IMPLANT
KIT ABG SYR 3ML LUER SLIP (SYRINGE) IMPLANT
MAT PREVALON FULL STRYKER (MISCELLANEOUS) IMPLANT
NDL HYPO 25X5/8 SAFETYGLIDE (NEEDLE) IMPLANT
NEEDLE HYPO 25X5/8 SAFETYGLIDE (NEEDLE) IMPLANT
NS IRRIG 1000ML POUR BTL (IV SOLUTION) ×1 IMPLANT
PACK C SECTION WH (CUSTOM PROCEDURE TRAY) ×1 IMPLANT
PAD OB MATERNITY 4.3X12.25 (PERSONAL CARE ITEMS) ×1 IMPLANT
RETRACTOR TRAXI PANNICULUS (MISCELLANEOUS) IMPLANT
RTRCTR C-SECT PINK 25CM LRG (MISCELLANEOUS) ×1 IMPLANT
STRIP CLOSURE SKIN 1/2X4 (GAUZE/BANDAGES/DRESSINGS) ×1 IMPLANT
SUT CHROMIC 2 0 CT 1 (SUTURE) ×1 IMPLANT
SUT MNCRL 0 VIOLET CTX 36 (SUTURE) ×1 IMPLANT
SUT MNCRL AB 3-0 PS2 27 (SUTURE) ×1 IMPLANT
SUT PLAIN 2 0 XLH (SUTURE) ×1 IMPLANT
SUT VIC AB 0 CT1 36 (SUTURE) ×1 IMPLANT
SUT VIC AB 0 CTX36XBRD ANBCTRL (SUTURE) ×3 IMPLANT
SUT VIC AB 2-0 SH 27XBRD (SUTURE) IMPLANT
TAPE CLOTH SURG 4X10 WHT LF (GAUZE/BANDAGES/DRESSINGS) IMPLANT
TOWEL OR 17X24 6PK STRL BLUE (TOWEL DISPOSABLE) ×1 IMPLANT
TRAY FOLEY W/BAG SLVR 14FR LF (SET/KITS/TRAYS/PACK) ×1 IMPLANT
WATER STERILE IRR 1000ML POUR (IV SOLUTION) ×1 IMPLANT

## 2023-08-13 NOTE — Anesthesia Preprocedure Evaluation (Signed)
 Anesthesia Evaluation  Patient identified by MRN, date of birth, ID band Patient awake    Reviewed: Allergy & Precautions, NPO status , Patient's Chart, lab work & pertinent test results  Airway Mallampati: II  TM Distance: >3 FB Neck ROM: Full    Dental   Pulmonary asthma    Pulmonary exam normal        Cardiovascular negative cardio ROS Normal cardiovascular exam     Neuro/Psych  Headaches    GI/Hepatic Neg liver ROS,GERD  ,,  Endo/Other  negative endocrine ROS    Renal/GU negative Renal ROS     Musculoskeletal   Abdominal   Peds  Hematology  (+) Blood dyscrasia, anemia   Anesthesia Other Findings   Reproductive/Obstetrics (+) Pregnancy                             Anesthesia Physical Anesthesia Plan  ASA: 2  Anesthesia Plan: Spinal   Post-op Pain Management:    Induction:   PONV Risk Score and Plan: 2 and Dexamethasone, Ondansetron and Treatment may vary due to age or medical condition  Airway Management Planned: Natural Airway  Additional Equipment:   Intra-op Plan:   Post-operative Plan:   Informed Consent: I have reviewed the patients History and Physical, chart, labs and discussed the procedure including the risks, benefits and alternatives for the proposed anesthesia with the patient or authorized representative who has indicated his/her understanding and acceptance.       Plan Discussed with: CRNA  Anesthesia Plan Comments:        Anesthesia Quick Evaluation

## 2023-08-13 NOTE — H&P (Signed)
 Deanna Garcia is a 23 y.o. female presenting for repeat c-section.  OB History     Gravida  3   Para  1   Term  1   Preterm  0   AB  1   Living  1      SAB  0   IAB  0   Ectopic  1   Multiple  0   Live Births  1        Obstetric Comments  "Bones were too small"        Past Medical History:  Diagnosis Date   Anxiety    Asthma    GERD (gastroesophageal reflux disease)    Migraines    Past Surgical History:  Procedure Laterality Date   CESAREAN SECTION     DIAGNOSTIC LAPAROSCOPY WITH REMOVAL OF ECTOPIC PREGNANCY N/A 12/19/2021   Procedure: DIAGNOSTIC LAPAROSCOPY WITH REMOVAL OF FALLOPIAN TUBE;  Surgeon: [place] Bing, MD;  Location: [facility] OR;  Service: Gynecology;  Laterality: N/A;   Family History: family history includes COPD in her mother; Healthy in her father. Social History:  reports that she has never smoked. She has been exposed to tobacco smoke. She has never used smokeless tobacco. She reports that she does not currently use drugs after having used the following drugs: Marijuana. She reports that she does not drink alcohol.     Maternal Diabetes: No Genetic Screening: Normal Maternal Ultrasounds/Referrals: Normal Fetal Ultrasounds or other Referrals:  None Maternal Substance Abuse:  No Significant Maternal Medications:  None Significant Maternal Lab Results:  Group B Strep negative Number of Prenatal Visits:greater than 3 verified prenatal visits Maternal Vaccinations:Tdap Other Comments:  None  Review of Systems No F/C/N/V/D  History   Blood pressure (!) 146/89, pulse 92, temperature 98.7 F (37.1 C), temperature source Oral, resp. rate 18, height 5\' 8"  (1.727 m), weight 114.3 kg, last menstrual period 11/12/2022, SpO2 96%. Exam Physical Exam  Lungs unlabored CV RRR Abdomen gravid, NT Extremities no calf tenderness  Prenatal labs: ABO, Rh: --/--/O POS (03/18 7829) Antibody: NEG (03/18 0952) Rubella: Immune (10/01 0000) RPR:  NON REACTIVE (03/18 0953)  HBsAg: Negative (10/01 0000)  HIV: Non-reactive (10/01 0000)  GBS: Negative/-- (02/28 0000)   Assessment/Plan: 54OZ [id number] at 39 1/7wks presenting for repeat c-section.  Risks benefits alternatives reviewed including but not limited to bleeding infection injury, questions answered and consent signed and witnessed.   Purcell Nails 08/13/2023, 12:38 PM

## 2023-08-13 NOTE — Op Note (Signed)
 Cesarean Section Procedure Note  Indications: P1 at 75 1/7wks with h/o c-section presenting for repeat c-section.  Pre-operative Diagnosis: 1.39 1/7wks 2.h/o c-section   Post-operative Diagnosis: 1.39 1/7wks 2.h/o c-section  Procedure: REPEAT LOW TRANSVERSE C-SECTION  Surgeon: Osborn Coho, MD    Assistants: Wyn Forster, MD  Anesthesia: Regional  Anesthesiologist: Marcene Duos, MD   Procedure Details  The patient was taken to the operating room secondary to h/o c-section after the risks, benefits, complications, treatment options, and expected outcomes were discussed with the patient.  The patient concurred with the proposed plan, giving informed consent which was signed and witnessed. The patient was taken to Operating Room B, identified as Deanna Garcia and the procedure verified as C-Section Delivery. A Time Out was held and the above information confirmed.  After induction of anesthesia by obtaining a spinal, the patient was prepped and draped in the usual sterile manner. A Pfannenstiel skin incision was made and carried down through the subcutaneous tissue to the underlying layer of fascia.  The fascia was incised bilaterally and extended transversely bilaterally with the Mayo scissors. Kocher clamps were placed on the inferior aspect of the fascial incision and the underlying rectus muscle was separated from the fascia. The same was done on the superior aspect of the fascial incision.  The peritoneum was identified, entered bluntly and extended manually.  An Alexis self-retaining retractor was placed.  The utero-vesical peritoneal reflection was incised transversely and the bladder flap was bluntly freed from the lower uterine segment. A low transverse uterine incision was made with the scalpel and extended bilaterally with the bandage scissors.  The infant was delivered in vertex position without difficulty.  After the umbilical cord was clamped and cut, the infant was handed  to the awaiting pediatricians.  Cord blood was obtained for evaluation.  The placenta was removed intact and appeared to be within normal limits. The uterus was cleared of all clots and debris. The uterine incision was closed with running interlocking sutures of 0 Vicryl and a second imbricating layer was performed as well.   Bilateral tubes and ovaries appeared to be within normal limits.  Good hemostasis was noted.  Copious irrigation was performed until clear.  The peritoneum was repaired with 2-0 chromic via a running suture.  The fascia was reapproximated with a running suture of 0 Vicryl. The subcutaneous tissue was reapproximated with 3 interrupted sutures of 2-0 plain.  The skin was reapproximated with a subcuticular suture of 3-0 monocryl.  Steristrips were applied.  Instrument, sponge, and needle counts were correct prior to abdominal closure and at the conclusion of the case.  The patient was awaiting transfer to the recovery room in good condition.  Findings: Live female infant with Apgars 9 at one minute and 9 at five minutes.  Normal appearing bilateral ovaries and fallopian tubes were noted.  Estimated Blood Loss:  308 ml         Drains: foley to gravity 150 cc         Total IV Fluids: 1300 ml         Specimens to Pathology: None         Complications:  None; patient tolerated the procedure well.         Disposition: PACU - hemodynamically stable.         Condition: stable  Attending Attestation: I performed the procedure.  I was present and scrubbed and the assistant was required due to complexity of anatomy.

## 2023-08-13 NOTE — Lactation Note (Signed)
 This note was copied from a baby's chart. Lactation Consultation Note  Patient Name: Deanna Garcia [id number] Date: 08/13/2023 Age:25 hours  Attempted to see mom. Mom stated BF going well. Mom stated baby d/t BF in 1 hr. Rn is going to assess baby at that time then will call LC to come.   Maternal Data    Feeding    LATCH Score                    Lactation Tools Discussed/Used    Interventions    Discharge    Consult Status      Deanna Garcia 08/13/2023, 10:29 PM

## 2023-08-13 NOTE — Anesthesia Procedure Notes (Signed)
 Spinal  Patient location during procedure: OR Start time: 08/13/2023 1:40 PM End time: 08/13/2023 1:47 PM Reason for block: surgical anesthesia Staffing Performed: anesthesiologist  Anesthesiologist: Marcene Duos, MD Performed by: Marcene Duos, MD Authorized by: Marcene Duos, MD   Preanesthetic Checklist Completed: patient identified, IV checked, site marked, risks and benefits discussed, surgical consent, monitors and equipment checked, pre-op evaluation and timeout performed Spinal Block Patient position: sitting Prep: DuraPrep Patient monitoring: heart rate, cardiac monitor, continuous pulse ox and blood pressure Approach: midline Location: L4-5 Injection technique: single-shot Needle Needle type: Pencan  Needle gauge: 24 G Needle length: 9 cm Assessment Sensory level: T4 Events: CSF return

## 2023-08-13 NOTE — Transfer of Care (Signed)
 Immediate Anesthesia Transfer of Care Note  Patient: Deanna Garcia  Procedure(s) Performed: CESAREAN DELIVERY  Patient Location: PACU  Anesthesia Type:Spinal  Level of Consciousness: awake, alert , and oriented  Airway & Oxygen Therapy: Patient Spontanous Breathing  Post-op Assessment: Report given to RN and Post -op Vital signs reviewed and stable  Post vital signs: Reviewed and stable  Last Vitals:  Vitals Value Taken Time  BP 123/70 08/13/23 1523  Temp    Pulse 71 08/13/23 1524  Resp 16 08/13/23 1524  SpO2 99 % 08/13/23 1524  Vitals shown include unfiled device data.  Last Pain:  Vitals:   08/13/23 1214  TempSrc: Oral         Complications: No notable events documented.

## 2023-08-14 DIAGNOSIS — D62 Acute posthemorrhagic anemia: Secondary | ICD-10-CM | POA: Diagnosis not present

## 2023-08-14 DIAGNOSIS — Z8709 Personal history of other diseases of the respiratory system: Secondary | ICD-10-CM

## 2023-08-14 DIAGNOSIS — O139 Gestational [pregnancy-induced] hypertension without significant proteinuria, unspecified trimester: Secondary | ICD-10-CM | POA: Diagnosis not present

## 2023-08-14 DIAGNOSIS — Z8659 Personal history of other mental and behavioral disorders: Secondary | ICD-10-CM

## 2023-08-14 LAB — CULTURE, OB URINE: Culture: NO GROWTH

## 2023-08-14 LAB — COMPREHENSIVE METABOLIC PANEL
ALT: 8 U/L (ref 0–44)
AST: 15 U/L (ref 15–41)
Albumin: 2.1 g/dL — ABNORMAL LOW (ref 3.5–5.0)
Alkaline Phosphatase: 85 U/L (ref 38–126)
Anion gap: 8 (ref 5–15)
BUN: 7 mg/dL (ref 6–20)
CO2: 22 mmol/L (ref 22–32)
Calcium: 8.8 mg/dL — ABNORMAL LOW (ref 8.9–10.3)
Chloride: 106 mmol/L (ref 98–111)
Creatinine, Ser: 0.61 mg/dL (ref 0.44–1.00)
GFR, Estimated: >60 mL/min (ref >60)
Glucose, Bld: 89 mg/dL (ref 70–99)
Potassium: 4.3 mmol/L (ref 3.5–5.1)
Sodium: 136 mmol/L (ref 135–145)
Total Bilirubin: 0.6 mg/dL (ref 0.0–1.2)
Total Protein: 5 g/dL — ABNORMAL LOW (ref 6.5–8.1)

## 2023-08-14 LAB — CBC
HCT: 28.6 % — ABNORMAL LOW (ref 36.0–46.0)
Hemoglobin: 9.3 g/dL — ABNORMAL LOW (ref 12.0–15.0)
MCH: 29.4 pg (ref 26.0–34.0)
MCHC: 32.5 g/dL (ref 30.0–36.0)
MCV: 90.5 fL (ref 80.0–100.0)
Platelets: 193 10*3/uL (ref 150–400)
RBC: 3.16 MIL/uL — ABNORMAL LOW (ref 3.87–5.11)
RDW: 14.5 % (ref 11.5–15.5)
WBC: 10.2 10*3/uL (ref 4.0–10.5)
nRBC: 0 % (ref 0.0–0.2)

## 2023-08-14 NOTE — Lactation Note (Signed)
 This note was copied from a baby's chart. Lactation Consultation Note  Patient Name: Deanna Garcia [id number] Date: 08/14/2023 Age:25 hours Reason for consult: Initial assessment;Term Mom had baby on the breast when LC came into rm. Baby was swaddled in blanket. Baby has good body alignment. Baby kept popping off and on breast. Suggested try football. LC assisted in football hold. Baby bF well.  Newborn feeding habits, STS, I&O, support positioning, body alignment reviewed. Mom encouraged to feed baby 8-12 times/24 hours and with feeding cues.  Mom has been leaking colostrum. Praised mom for good supply and feeding. Encouraged to call for assistance as needed.   Maternal Data Has patient been taught Hand Expression?: Yes Does the patient have breastfeeding experience prior to this delivery?: Yes How long did the patient breastfeed?: a few weeks  Feeding    LATCH Score Latch: Grasps breast easily, tongue down, lips flanged, rhythmical sucking.  Audible Swallowing: Spontaneous and intermittent  Type of Nipple: Everted at rest and after stimulation  Comfort (Breast/Nipple): Soft / non-tender  Hold (Positioning): Assistance needed to correctly position infant at breast and maintain latch.  LATCH Score: 9   Lactation Tools Discussed/Used    Interventions Interventions: Breast feeding basics reviewed;Assisted with latch;Skin to skin;Breast massage;Hand express;Breast compression;Adjust position;Support pillows;Position options;Education;LC Services brochure  Discharge    Consult Status Consult Status: Follow-up Date: 08/14/23 Follow-up type: In-patient    Charyl Dancer 08/14/2023, 12:45 AM

## 2023-08-14 NOTE — Lactation Note (Addendum)
 This note was copied from a baby's chart. Lactation Consultation Note  Patient Name: Deanna Garcia [id number] Date: 08/14/2023 Age:25 hours Reason for consult: Follow-up assessment;Term;Infant weight loss (6 % weight loss) Hearing screen tech called the LC to let her know mom and baby were ready. LC checked and changed the diaper, large wet , and spit up old formula.  LC assisted to last on the right breast, football. STS and noted areola edema. LC reviewed hand expressing and the reverse pressure to elongate the nipple / areola complex. Areola more compressible for a deep latch and still feeding at 12 mins with increased swallows. Per mom comfortable.  LC recommended feeding with cues and by 3 hours, if baby is still hungry after the 1st breast offer the 2nd breast. If satisfied hold off on the formula. If feeding any formula keep it low volume.  Maternal Data Has patient been taught Hand Expression?: Yes  Feeding Mother's Current Feeding Choice: Breast Milk and Formula  LATCH Score Latch: Grasps breast easily, tongue down, lips flanged, rhythmical sucking.  Audible Swallowing: Spontaneous and intermittent  Type of Nipple: Everted at rest and after stimulation  Comfort (Breast/Nipple): Soft / non-tender  Hold (Positioning): Assistance needed to correctly position infant at breast and maintain latch.  LATCH Score: 9   Lactation Tools Discussed/Used  Not needed  Interventions Interventions: Breast feeding basics reviewed;Assisted with latch;Skin to skin;Breast compression;Support pillows;Position options;Education;LC Services brochure;CDC milk storage guidelines;CDC Guidelines for Breast Pump Cleaning  Discharge Pump: Personal;Hands Free  Consult Status Consult Status: Follow-up Date: 08/15/23 Follow-up type: In-patient    [name] [name] 08/14/2023, 3:12 PM

## 2023-08-14 NOTE — Lactation Note (Signed)
 This note was copied from a baby's chart. Lactation Consultation Note  Patient Name: Deanna Garcia [id number] Date: 08/14/2023 Age:25 hours Reason for consult: Follow-up assessment;Infant weight loss;Term (6 % weight loss, presently the tech is doing the baby's hearing screen and dad is helping mom to the bathroom. LC will F/U)   Maternal Data    Feeding Mother's Current Feeding Choice: Breast Milk and Formula Nipple Type: Slow - flow  LATCH Score - 9's      Consult Status Consult Status: Follow-up Date: 08/14/23 Follow-up type: In-patient    [name] Edelyn Heidel 08/14/2023, 2:39 PM

## 2023-08-14 NOTE — Progress Notes (Signed)
 MOB was referred for history of anxiety.  * Referral screened out by Clinical Social Worker because none of the following criteria appear to apply:  ~ History of anxiety during this pregnancy, or of post-partum depression following prior delivery.  ~ Diagnosis of anxiety within last 3 years  Per OB notes, MOB did not indicate any signs/symptoms during her pregnancy. Anxiety dates back to 2014  OR  * MOB's symptoms currently being treated with medication and/or therapy.  Please contact the Clinical Social Worker if needs arise, by [facility] request, or if MOB scores greater than 9/yes to question 10 on [place] Postpartum Depression Screen.   Enos Fling, Theresia Majors Clinical Social Worker [phone]

## 2023-08-14 NOTE — Anesthesia Postprocedure Evaluation (Signed)
 Anesthesia Post Note  Patient: Deanna Garcia  Procedure(s) Performed: CESAREAN DELIVERY     Patient location during evaluation: PACU Anesthesia Type: Spinal Level of consciousness: awake and alert Pain management: pain level controlled Vital Signs Assessment: post-procedure vital signs reviewed and stable Respiratory status: spontaneous breathing and respiratory function stable Cardiovascular status: blood pressure returned to baseline and stable Postop Assessment: spinal receding Anesthetic complications: no   No notable events documented.  Last Vitals:  Vitals:   08/14/23 1454 08/14/23 2036  BP: 114/71 129/63  Pulse: 71 70  Resp: 16 16  Temp: 36.7 C 36.7 C  SpO2: 99% 99%    Last Pain:  Vitals:   08/14/23 2036  TempSrc: Oral  PainSc:                  Kennieth Rad

## 2023-08-14 NOTE — Progress Notes (Signed)
 Deanna Garcia [id number] Postpartum Postoperative Day # 1  Clearence Ped, [place], [redacted]w[redacted]d, S/P RCS LT Cesarean Section due to Elective. Pt was admitted on 3/19 for RCS with DR Su Hilt, was taken to OR for spinal anesthesia, QBL was , hgb drop of 10.9-9.3, asymptomatic and on po iron. Developed GHTN but light call first BP upon admission was elevated,  but since normotensive. PCR resulted PP 2.26, UC sent to r/o UTI, asymptomatic.   Patient Active Problem List   Diagnosis Date Noted   H/O anxiety disorder 08/14/2023   H/O intrinsic asthma 08/14/2023   ABLA (acute blood loss anemia) 08/14/2023   Normal postpartum course 08/14/2023   Gestational hypertension 08/14/2023   Other specified postprocedural states 08/13/2023   Status post repeat low transverse cesarean section 08/13/2023   Obesity affecting pregnancy, antepartum 04/16/2023   Pregnancy with history of cesarean section, antepartum 03/19/2023   History of unilateral salpingectomy 03/19/2023   History of ectopic pregnancy 01/09/2022     Active Ambulatory Problems    Diagnosis Date Noted   History of ectopic pregnancy 01/09/2022   Pregnancy with history of cesarean section, antepartum 03/19/2023   History of unilateral salpingectomy 03/19/2023   Obesity affecting pregnancy, antepartum 04/16/2023   Resolved Ambulatory Problems    Diagnosis Date Noted   No Resolved Ambulatory Problems   Past Medical History:  Diagnosis Date   Anxiety    Asthma    GERD (gastroesophageal reflux disease)    Migraines      Subjective: Patient up ad lib, denies syncope or dizziness. Reports consuming regular diet without issues and denies N/V. Patient reports 0 bowel movement + passing flatus.  Denies issues with urination and reports bleeding is "lighter."  Patient is BR and BT feeding and reports going well.  Desires undecided for postpartum contraception.  Pain is being appropriately managed with use of po meds. Denies HA, RUQ pain  or vision changes.    Objective: Patient Vitals for the past 24 hrs:  BP Temp Temp src Pulse Resp SpO2 Height Weight  08/14/23 0335 119/63 97.7 F (36.5 C) Oral 66 16 100 % -- --  08/13/23 2340 123/74 98.4 F (36.9 C) Oral 78 17 100 % -- --  08/13/23 1940 123/68 99 F (37.2 C) Oral 75 16 99 % -- --  08/13/23 1843 117/71 99.2 F (37.3 C) Oral 65 16 99 % -- --  08/13/23 1735 124/87 98.8 F (37.1 C) Oral 74 16 97 % -- --  08/13/23 1639 91/71 98.7 F (37.1 C) Oral 77 16 100 % -- --  08/13/23 1630 129/71 -- -- 68 18 95 % -- --  08/13/23 1615 119/65 -- -- 64 18 97 % -- --  08/13/23 1600 121/71 -- -- 64 16 99 % -- --  08/13/23 1545 120/70 -- -- 70 19 100 % -- --  08/13/23 1530 124/73 97.7 F (36.5 C) Oral 70 13 99 % -- --  08/13/23 1245 132/85 -- -- -- -- -- -- --  08/13/23 1214 (!) 146/89 98.7 F (37.1 C) Oral 92 18 96 % 5\' 8"  (1.727 m) 114.3 kg     Physical Exam:  General: alert, cooperative, and appears stated age Mood/Affect: happy Lungs: clear to auscultation, no wheezes, rales or rhonchi, symmetric air entry.  Heart: normal rate, regular rhythm, normal S1, S2, no murmurs, rubs, clicks or gallops. Breast: breasts appear normal, no suspicious masses, no skin or nipple changes or axillary nodes. Abdomen:  + bowel  sounds, soft, non-tender Incision: healing well, no significant drainage, no dehiscence, Honeycomb dressing  Uterine Fundus: firm, involution -1 Lochia: appropriate Skin: Warm, Dry. DVT Evaluation: No evidence of DVT seen on physical exam. Negative Homan's sign. No cords or calf tenderness. No significant calf/ankle edema.  Labs: Recent Labs    08/12/23 0953 08/14/23 0532  HGB 10.9* 9.3*  HCT 32.6* 28.6*  WBC 7.8 10.2    CBG (last 3)  No results for input(s): "GLUCAP" in the last 72 hours.   I/O: I/O last 3 completed shifts: In: 1560.6 [I.V.:1300; IV Piggyback:260.6] Out: 2358 [Urine:2025; Blood:333]   Assessment Postpartum Postoperative Day # 1   Clearence Ped, [id number], [redacted]w[redacted]d, S/P RCS LT Cesarean Section due to Elective. Pt was admitted on 3/19 for RCS with DR Su Hilt, was taken to OR for spinal anesthesia, QBL was , hgb drop of 10.9-9.3, asymptomatic and on po iron. Developed GHTN but light call first BP upon admission was elevated,  but since normotensive. PCR resulted PP 2.26, UC sent to r/o UTI, asymptomatic.  Pt stable. -1 Involution. Br/BT Feeding. Hemodynamically Stable.  Plan: Continue other mgmt as ordered GHTN: monitor BP. UC pending for elevated PCR. Monitor for preE will dx if BP become elevated.  VTE Prophylactics: SCD, ambulated as tolerates.  Pain control: Motrin/Tylenol/Narcotics PRN Education given regarding options for contraception, including barrier methods, injectable contraception, IUD placement, oral contraceptives.  Plan for discharge tomorrow, Breastfeeding, and Lactation consult  Dr. Normand Sloop to be updated on patient status  [facility], FNP-C, PMHNP-BC  [id number]  [place], [place] [address]  Cell: [phone]  Office Phone: [phone] Fax: [phone] 08/14/2023  10:51 AM

## 2023-08-15 MED ORDER — ACETAMINOPHEN 500 MG PO TABS: 1000.0000 mg | ORAL_TABLET | Freq: Four times a day (QID) | ORAL | 0 refills | Status: AC

## 2023-08-15 MED ORDER — IBUPROFEN 600 MG PO TABS: 600.0000 mg | ORAL_TABLET | Freq: Four times a day (QID) | ORAL | Status: AC

## 2023-08-15 NOTE — Discharge Summary (Signed)
 Postpartum Discharge Summary  Date of Service updated 08/15/23   Patient Name: Deanna Garcia DOB: [birth year] MRN: [id number]  Date of admission: 08/13/2023 Delivery date:08/13/2023 Delivering provider: Osborn Coho Date of discharge: 08/15/2023  Admitting diagnosis: Other specified postprocedural states [Z98.890] Status post repeat low transverse cesarean section [Z98.891] Intrauterine pregnancy: [redacted]w[redacted]d     Secondary diagnosis:  Principal Problem:   Other specified postprocedural states Active Problems:   Status post repeat low transverse cesarean section   H/O anxiety disorder   H/O intrinsic asthma   ABLA (acute blood loss anemia)   Normal postpartum course   Gestational hypertension  Additional problems: none    Discharge diagnosis: Term Pregnancy Delivered and Gestational Hypertension                                              Post partum procedures: none Augmentation: N/A Complications: None  Hospital course: Scheduled C/S   23 y.o. yo [id number] at [redacted]w[redacted]d was admitted to the hospital 08/13/2023 for scheduled cesarean section with the following indication:Elective Repeat.Delivery details are as follows:  Membrane Rupture Time/Date: 2:17 PM,08/13/2023  Delivery Method:C-Section, Low Transverse Operative Delivery:N/A Details of operation can be found in separate operative note.  Patient had an uncomplicated postpartum course. Gestational hypertension versus pre-eclampsia after one elevated blood pressure and a protein creatinine ratio of 2.26. All other labs were normal, blood pressure remained normotensive, pt was asymptomatic, and no antihypertensive management was required for the duration of the labor and postpartum period. Pt was given clear instructions on pre-eclampsia warning signs and reasons to call the [facility] provider or seek emergency assistance. She is ambulating, tolerating a regular diet, passing flatus, and urinating well. Patient is discharged home in stable  condition on  08/15/23        Newborn Data: Birth date:08/13/2023 Birth time:2:18 PM Gender:Female Living status:Living Apgars:9 ,9  Weight:3230 g    Magnesium Sulfate received: No BMZ received: No Rhophylac:N/A MMR:N/A T-DaP:Given prenatally Flu: No RSV Vaccine received: No Transfusion:No Immunizations administered:  There is no immunization history on file for this patient.  Physical exam  Vitals:   08/14/23 0335 08/14/23 1454 08/14/23 2036 08/15/23 0506  BP: 119/63 114/71 129/63 124/60  Pulse: 66 71 70 79  Resp: 16 16 16    Temp: 97.7 F (36.5 C) 98 F (36.7 C) 98 F (36.7 C) 97.8 F (36.6 C)  TempSrc: Oral Oral Oral Oral  SpO2: 100% 99% 99% 98%  Weight:      Height:       General: alert, cooperative, and no distress Lochia: appropriate Uterine Fundus: firm Incision: Dressing is clean, dry, and intact DVT Evaluation: No evidence of DVT seen on physical exam. No cords or calf tenderness. No significant calf/ankle edema. Labs: Lab Results  Component Value Date   WBC 10.2 08/14/2023   HGB 9.3 (L) 08/14/2023   HCT 28.6 (L) 08/14/2023   MCV 90.5 08/14/2023   PLT 193 08/14/2023      Latest Ref Rng & Units 08/14/2023    5:32 AM  CMP  Glucose 70 - 99 mg/dL 89   BUN 6 - 20 mg/dL 7   Creatinine 1.02 - 7.25 mg/dL 3.66   Sodium 440 - 347 mmol/L 136   Potassium 3.5 - 5.1 mmol/L 4.3   Chloride 98 - 111 mmol/L 106   CO2 22 - 32  mmol/L 22   Calcium 8.9 - 10.3 mg/dL 8.8   Total Protein 6.5 - 8.1 g/dL 5.0   Total Bilirubin 0.0 - 1.2 mg/dL 0.6   Alkaline Phos 38 - 126 U/L 85   AST 15 - 41 U/L 15   ALT 0 - 44 U/L 8    Edinburgh Score:    08/14/2023    8:05 AM  Edinburgh Postnatal Depression Scale Screening Tool  I have been able to laugh and see the funny side of things. 0  I have looked forward with enjoyment to things. 0  I have blamed myself unnecessarily when things went wrong. 0  I have been anxious or worried for no good reason. 1  I have felt scared  or panicky for no good reason. 0  Things have been getting on top of me. 0  I have been so unhappy that I have had difficulty sleeping. 0  I have felt sad or miserable. 0  I have been so unhappy that I have been crying. 0  The thought of harming myself has occurred to me. 0  Edinburgh Postnatal Depression Scale Total 1      After visit meds:  Allergies as of 08/15/2023       Reactions   Penicillins Hives   Amoxicillin Rash        Medication List     TAKE these medications    acetaminophen 500 MG tablet Commonly known as: TYLENOL Take 2 tablets (1,000 mg total) by mouth every 6 (six) hours.   ferrous sulfate 325 (65 FE) MG tablet Take 1 tablet (325 mg total) by mouth daily.   ibuprofen 600 MG tablet Commonly known as: ADVIL Take 1 tablet (600 mg total) by mouth every 6 (six) hours.   PRENATAL PO Take 1 tablet by mouth daily.               Discharge Care Instructions  (From admission, onward)           Start     Ordered   08/15/23 0000  Discharge wound care:       Comments: Take dressing off on 08/18/23, remove it sooner if it is dirty or damaged. Clean area with soap and water and pat dry. You can leave the steri strips on until they fall off or take them off gently by 08/21/23. Call the office for increased drainage, redness, pain, or warmth. Keep the incision area clean and dry at all times.   08/15/23 1338             Discharge home in stable condition Infant Feeding: Breast and formula Infant Disposition:home with mother Discharge instruction: per After Visit Summary and Postpartum booklet. Activity: Advance as tolerated. Pelvic rest for 6 weeks.  Diet: routine diet Anticipated Birth Control: Unsure and options reviewed Postpartum Appointment:6 weeks Additional Postpartum F/U:  none Future Appointments:No future appointments. Follow up Visit:  Follow-up Information     Osborn Coho, MD. Schedule an appointment as soon as possible for  a visit in 6 week(s).   Specialty: Obstetrics and Gynecology Contact information: [address] [place] [address] [phone]                     08/15/2023 Roma Schanz, CNM

## 2023-08-15 NOTE — Lactation Note (Signed)
 This note was copied from a baby's chart. Lactation Consultation Note  Patient Name: Deanna Garcia Date: 08/15/2023 Age:25 hours Reason for consult: Maternal discharge;Term  P2, 39 wks, @ 46 hrs of life. Discharge anticipated. Mom has great running white milk with single hand expression. Praised supply, Education officer, community with each pregnancy. Encouraged mom to keep working on big mouth latch with baby and use EBM or coconut oil after each feed. Demonstrated steps of latching, baby falls soundly asleep after 5 minutes. Encouraged mom not really feeding time- still full from last formula feed. Encouraged hand expression to start, breast compression to keep infant working at breast. Discussed differences in breast and bottle flow, and infants impatience during cluster feeding. Discussed cluster feeding overnight/ early morning brings in our milk supply, shared expectations of milk coming in. Highlighted risk of engorgement. Discussed hand pump/express to soften breasts, motrin as anti-inflammatory, and ice packs for 10-20 minutes post feed/pumping if still over-full is the best treatments for inflamed/engorged breasts.  Maternal Data Does the patient have breastfeeding experience prior to this delivery?: Yes How long did the patient breastfeed?: 1 1/2 days  Feeding Mother's Current Feeding Choice: Breast Milk and Formula Nipple Type: Slow - flow  LATCH Score Latch: Grasps breast easily, tongue down, lips flanged, rhythmical sucking.  Audible Swallowing: Spontaneous and intermittent  Type of Nipple: Everted at rest and after stimulation  Comfort (Breast/Nipple): Soft / non-tender  Hold (Positioning): Assistance needed to correctly position infant at breast and maintain latch.  LATCH Score: 9   Lactation Tools Discussed/Used    Interventions Interventions: Breast feeding basics reviewed;Assisted with latch;Hand express;Breast compression;Expressed milk;Education;LC  Services brochure;CDC milk storage guidelines  Discharge Discharge Education: Engorgement and breast care Pump: Personal;Hands Free;Manual  Consult Status Consult Status: Complete Date: 08/15/23    Idamae Lusher 08/15/2023, 1:17 PM

## 2023-08-23 ENCOUNTER — Telehealth (HOSPITAL_COMMUNITY): Payer: Self-pay

## 2023-08-23 NOTE — Telephone Encounter (Signed)
 08/23/2023 1213  Name: Deanna Garcia MRN: [id number] DOB: [birth year]  Reason for Call:  Transition of Care Hospital Discharge Call  Contact Status: Patient Contact Status: Complete  Language assistant needed:          Follow-Up Questions: Do You Have Any Concerns About Your Health As You Heal From Delivery?: No Do You Have Any Concerns About Your Infants Health?: No  Edinburgh Postnatal Depression Scale:  In the Past 7 Days: I have been able to laugh and see the funny side of things.: As much as I always could I have looked forward with enjoyment to things.: As much as I ever did I have blamed myself unnecessarily when things went wrong.: Not very often I have been anxious or worried for no good reason.: No, not at all I have felt scared or panicky for no good reason.: No, not at all Things have been getting on top of me.: No, I have been coping as well as ever I have been so unhappy that I have had difficulty sleeping.: Not at all I have felt sad or miserable.: No, not at all I have been so unhappy that I have been crying.: No, never The thought of harming myself has occurred to me.: Never Inocente Salles Postnatal Depression Scale Total: 1  PHQ2-9 Depression Scale:     Discharge Follow-up: Edinburgh score requires follow up?: No Patient was advised of the following resources:: Breastfeeding Support Group, Support Group  Post-discharge interventions: Reviewed Newborn Safe Sleep Practices  Signature  Signe Colt

## 2023-09-06 DIAGNOSIS — Z419 Encounter for procedure for purposes other than remedying health state, unspecified: Secondary | ICD-10-CM | POA: Diagnosis not present

## 2023-09-25 DIAGNOSIS — Z1331 Encounter for screening for depression: Secondary | ICD-10-CM | POA: Diagnosis not present

## 2023-10-06 DIAGNOSIS — Z419 Encounter for procedure for purposes other than remedying health state, unspecified: Secondary | ICD-10-CM | POA: Diagnosis not present

## 2023-11-06 DIAGNOSIS — Z419 Encounter for procedure for purposes other than remedying health state, unspecified: Secondary | ICD-10-CM | POA: Diagnosis not present

## 2023-12-06 DIAGNOSIS — Z419 Encounter for procedure for purposes other than remedying health state, unspecified: Secondary | ICD-10-CM | POA: Diagnosis not present

## 2024-01-06 DIAGNOSIS — Z419 Encounter for procedure for purposes other than remedying health state, unspecified: Secondary | ICD-10-CM | POA: Diagnosis not present

## 2024-02-06 DIAGNOSIS — Z419 Encounter for procedure for purposes other than remedying health state, unspecified: Secondary | ICD-10-CM | POA: Diagnosis not present
